# Patient Record
Sex: Female | Born: 1940 | Race: White | Hispanic: No | Marital: Married | Smoking: Former smoker
Health system: Southern US, Community
[De-identification: ages and names within clinical notes are randomized; demographics above are authoritative.]

## PROBLEM LIST (undated history)

## (undated) DIAGNOSIS — J301 Allergic rhinitis due to pollen: Secondary | ICD-10-CM

## (undated) DIAGNOSIS — R000 Tachycardia, unspecified: Secondary | ICD-10-CM

## (undated) DIAGNOSIS — R05 Cough: Secondary | ICD-10-CM

## (undated) DIAGNOSIS — R413 Other amnesia: Principal | ICD-10-CM

## (undated) DIAGNOSIS — I10 Essential (primary) hypertension: Secondary | ICD-10-CM

## (undated) DIAGNOSIS — R7301 Impaired fasting glucose: Secondary | ICD-10-CM

## (undated) DIAGNOSIS — M899 Disorder of bone, unspecified: Secondary | ICD-10-CM

## (undated) DIAGNOSIS — R053 Chronic cough: Secondary | ICD-10-CM

## (undated) DIAGNOSIS — J310 Chronic rhinitis: Secondary | ICD-10-CM

## (undated) DIAGNOSIS — R269 Unspecified abnormalities of gait and mobility: Secondary | ICD-10-CM

## (undated) DIAGNOSIS — M85852 Other specified disorders of bone density and structure, left thigh: Secondary | ICD-10-CM

## (undated) DIAGNOSIS — E785 Hyperlipidemia, unspecified: Secondary | ICD-10-CM

## (undated) HISTORY — PX: TUBAL LIGATION: SHX77

## (undated) HISTORY — DX: Tachycardia, unspecified: R00.0

## (undated) HISTORY — PX: DILATION AND CURETTAGE OF UTERUS: SHX78

## (undated) HISTORY — DX: Other amnesia: R41.3

## (undated) HISTORY — DX: Allergic rhinitis due to pollen: J30.1

## (undated) HISTORY — DX: Unspecified abnormalities of gait and mobility: R26.9

## (undated) HISTORY — DX: Cough: R05

## (undated) HISTORY — DX: Disorder of bone, unspecified: M89.9

## (undated) HISTORY — DX: Impaired fasting glucose: R73.01

## (undated) HISTORY — PX: COLONOSCOPY: SHX174

## (undated) HISTORY — DX: Other specified disorders of bone density and structure, left thigh: M85.852

## (undated) HISTORY — DX: Chronic cough: R05.3

## (undated) HISTORY — DX: Essential (primary) hypertension: I10

## (undated) HISTORY — DX: Hyperlipidemia, unspecified: E78.5

## (undated) HISTORY — DX: Chronic rhinitis: J31.0

---

## 1997-12-20 ENCOUNTER — Ambulatory Visit (HOSPITAL_COMMUNITY): Admission: RE | Admit: 1997-12-20 | Discharge: 1997-12-20 | Payer: Self-pay | Admitting: Family Medicine

## 1997-12-26 ENCOUNTER — Other Ambulatory Visit: Admission: RE | Admit: 1997-12-26 | Discharge: 1997-12-26 | Payer: Self-pay | Admitting: Family Medicine

## 1999-01-03 ENCOUNTER — Other Ambulatory Visit: Admission: RE | Admit: 1999-01-03 | Discharge: 1999-01-03 | Payer: Self-pay | Admitting: Family Medicine

## 2000-01-01 ENCOUNTER — Other Ambulatory Visit: Admission: RE | Admit: 2000-01-01 | Discharge: 2000-01-01 | Payer: Self-pay | Admitting: Family Medicine

## 2000-12-11 ENCOUNTER — Encounter: Payer: Self-pay | Admitting: Family Medicine

## 2000-12-11 ENCOUNTER — Encounter: Admission: RE | Admit: 2000-12-11 | Discharge: 2000-12-11 | Payer: Self-pay | Admitting: Family Medicine

## 2001-01-01 ENCOUNTER — Other Ambulatory Visit: Admission: RE | Admit: 2001-01-01 | Discharge: 2001-01-01 | Payer: Self-pay | Admitting: Family Medicine

## 2001-12-22 ENCOUNTER — Encounter: Admission: RE | Admit: 2001-12-22 | Discharge: 2001-12-22 | Payer: Self-pay | Admitting: Family Medicine

## 2001-12-22 ENCOUNTER — Encounter: Payer: Self-pay | Admitting: Family Medicine

## 2002-01-05 ENCOUNTER — Other Ambulatory Visit: Admission: RE | Admit: 2002-01-05 | Discharge: 2002-01-05 | Payer: Self-pay | Admitting: Family Medicine

## 2003-01-04 ENCOUNTER — Encounter: Payer: Self-pay | Admitting: Family Medicine

## 2003-01-04 ENCOUNTER — Encounter: Admission: RE | Admit: 2003-01-04 | Discharge: 2003-01-04 | Payer: Self-pay | Admitting: Family Medicine

## 2003-01-07 ENCOUNTER — Other Ambulatory Visit: Admission: RE | Admit: 2003-01-07 | Discharge: 2003-01-07 | Payer: Self-pay | Admitting: Family Medicine

## 2003-01-12 ENCOUNTER — Encounter: Admission: RE | Admit: 2003-01-12 | Discharge: 2003-01-12 | Payer: Self-pay | Admitting: Family Medicine

## 2003-01-12 ENCOUNTER — Encounter: Payer: Self-pay | Admitting: Family Medicine

## 2003-02-01 ENCOUNTER — Encounter: Payer: Self-pay | Admitting: Family Medicine

## 2003-02-01 ENCOUNTER — Encounter: Admission: RE | Admit: 2003-02-01 | Discharge: 2003-02-01 | Payer: Self-pay | Admitting: Family Medicine

## 2004-01-05 ENCOUNTER — Encounter: Admission: RE | Admit: 2004-01-05 | Discharge: 2004-01-05 | Payer: Self-pay | Admitting: Family Medicine

## 2004-01-11 ENCOUNTER — Other Ambulatory Visit: Admission: RE | Admit: 2004-01-11 | Discharge: 2004-01-11 | Payer: Self-pay | Admitting: Family Medicine

## 2005-01-14 ENCOUNTER — Encounter: Admission: RE | Admit: 2005-01-14 | Discharge: 2005-01-14 | Payer: Self-pay | Admitting: Family Medicine

## 2005-01-14 ENCOUNTER — Other Ambulatory Visit: Admission: RE | Admit: 2005-01-14 | Discharge: 2005-01-14 | Payer: Self-pay | Admitting: Family Medicine

## 2005-01-29 ENCOUNTER — Ambulatory Visit: Payer: Self-pay | Admitting: Internal Medicine

## 2005-02-15 ENCOUNTER — Ambulatory Visit: Payer: Self-pay | Admitting: Internal Medicine

## 2005-08-29 ENCOUNTER — Emergency Department (HOSPITAL_COMMUNITY): Admission: EM | Admit: 2005-08-29 | Discharge: 2005-08-29 | Payer: Self-pay | Admitting: Emergency Medicine

## 2006-01-16 ENCOUNTER — Encounter: Admission: RE | Admit: 2006-01-16 | Discharge: 2006-01-16 | Payer: Self-pay | Admitting: Family Medicine

## 2006-01-27 ENCOUNTER — Encounter: Admission: RE | Admit: 2006-01-27 | Discharge: 2006-01-27 | Payer: Self-pay | Admitting: Family Medicine

## 2007-01-19 ENCOUNTER — Encounter: Admission: RE | Admit: 2007-01-19 | Discharge: 2007-01-19 | Payer: Self-pay | Admitting: Family Medicine

## 2007-12-28 IMAGING — CT CT HEAD W/O CM
1 series · 16 of 30 positions shown, 20 images · IV contrast (agent unspecified)
Comparison: None available.

CLINICAL DATA: Fell striking forehead.  
 HEAD CT WITHOUT CONTRAST:
TECHNIQUE: Contiguous axial images were obtained from the base of the skull through the vertex according to standard protocol without contrast.

[Series 2: head_seq 4.5 h45s st · axial · 0.43mm/px · z∈[-161,-17]mm · 16 of 36 slices shown, 20 images]
[im 2/36  brain]
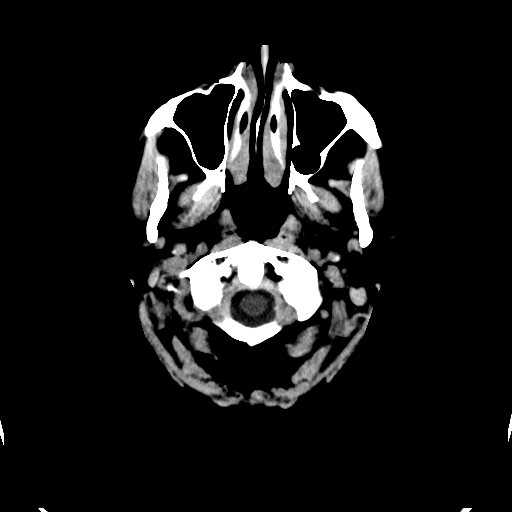
[im 2/36  bone]
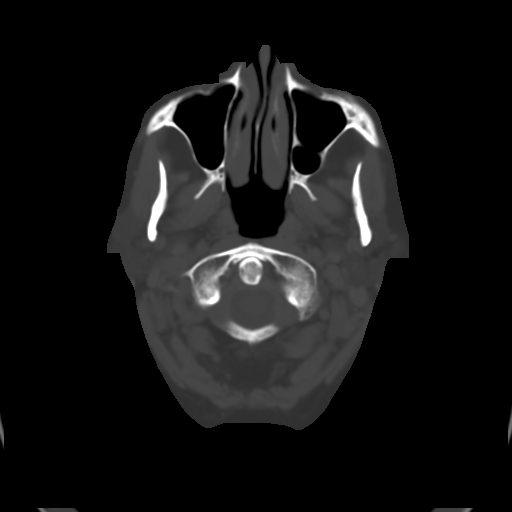
[im 4/36  brain]
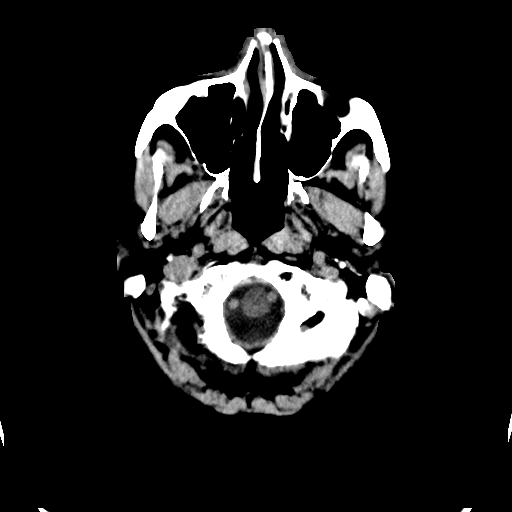
[im 7/36  brain]
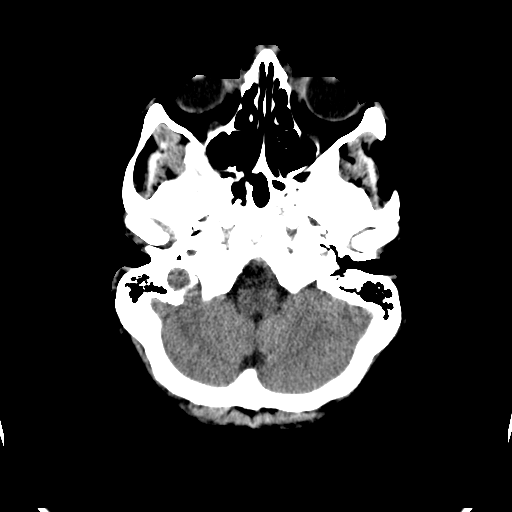
[im 9/36  brain]
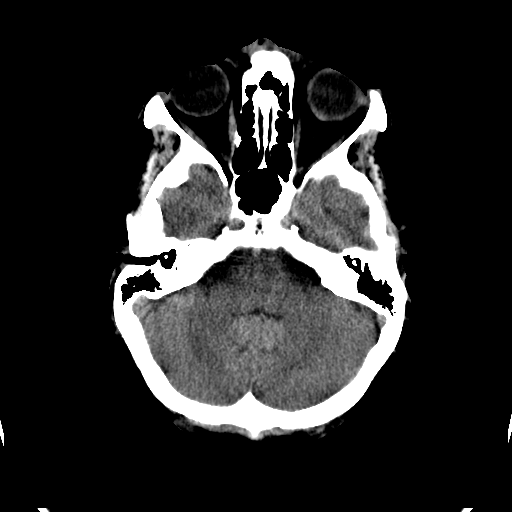
[im 10/36  brain]
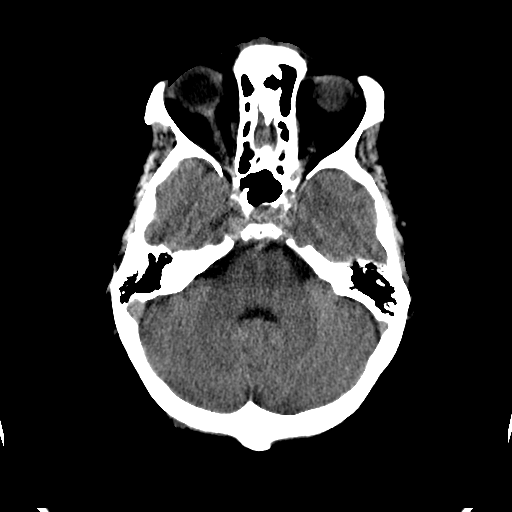
[im 10/36  bone]
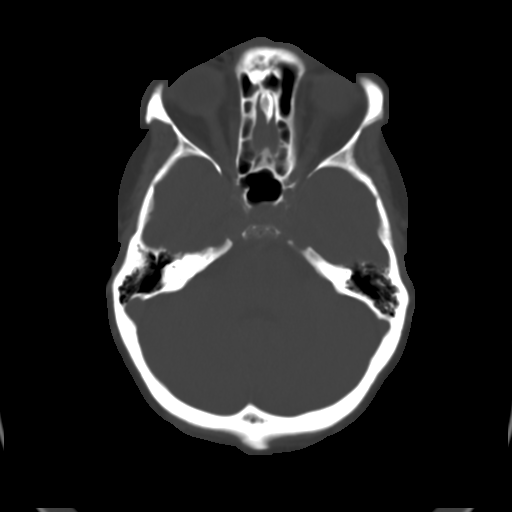
[im 13/36  brain]
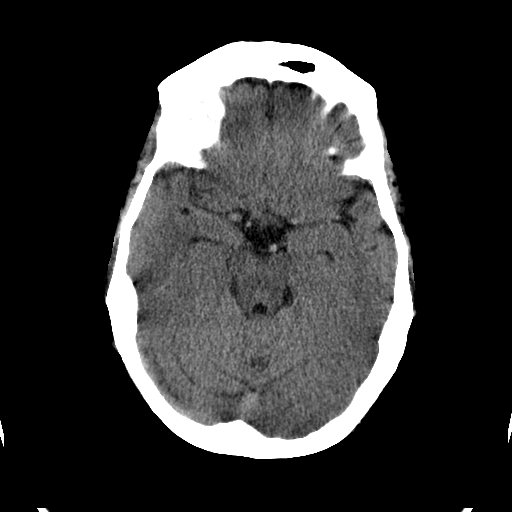
[im 15/36  brain]
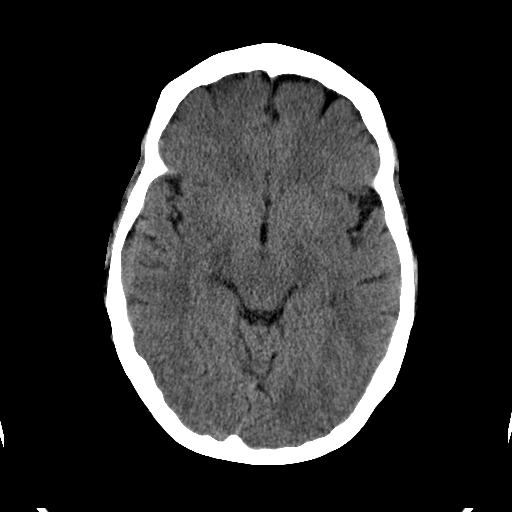
[im 17/36  brain]
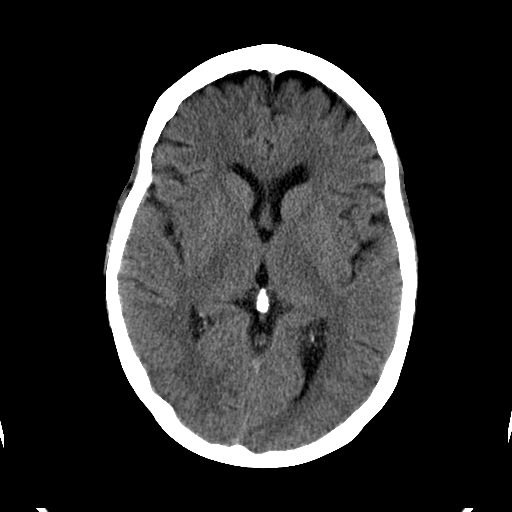
[im 19/36  brain]
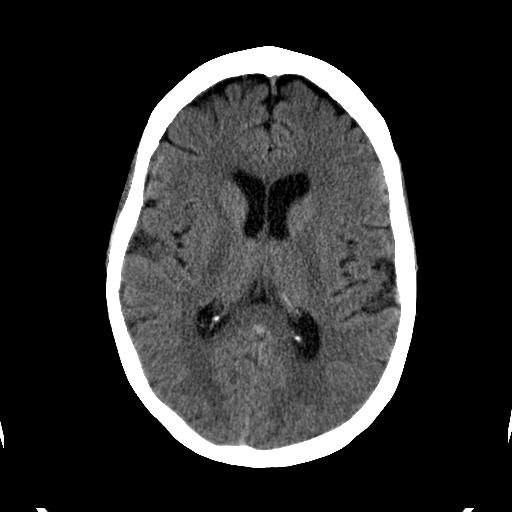
[im 19/36  bone]
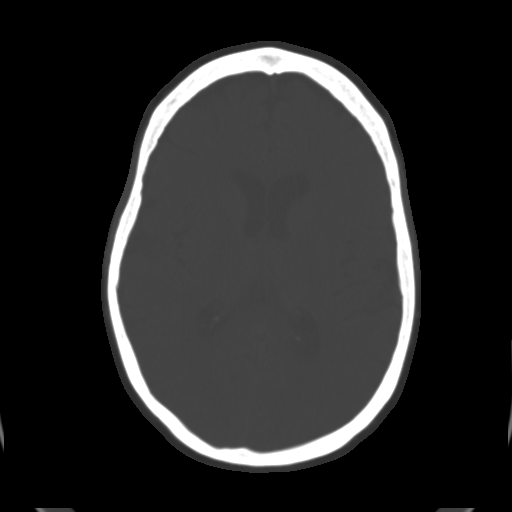
[im 21/36  brain]
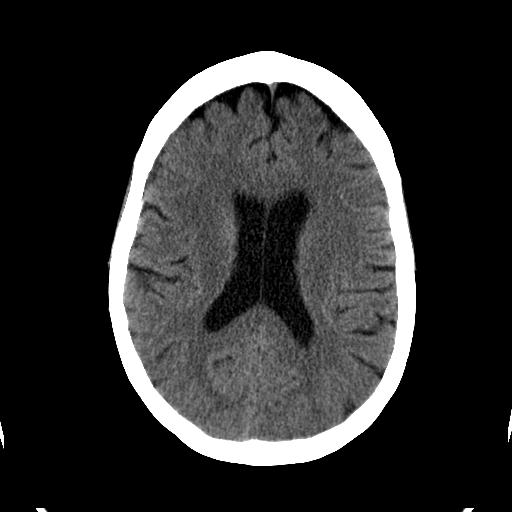
[im 23/36  brain]
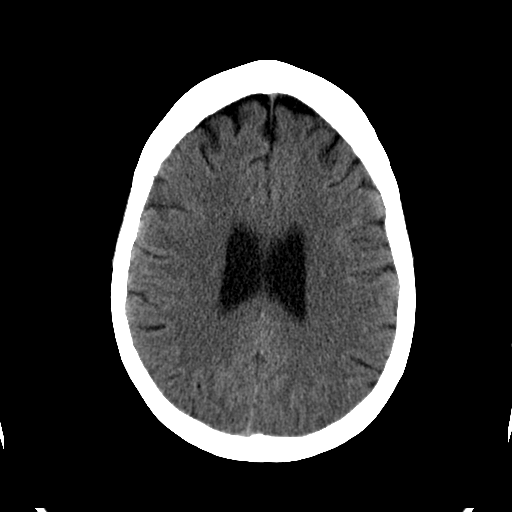
[im 26/36  brain]
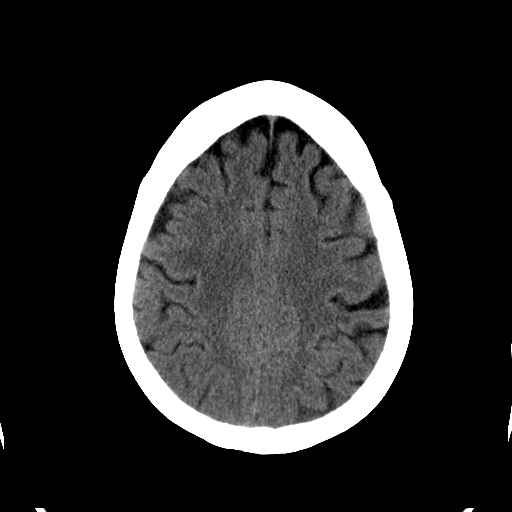
[im 27/36  brain]
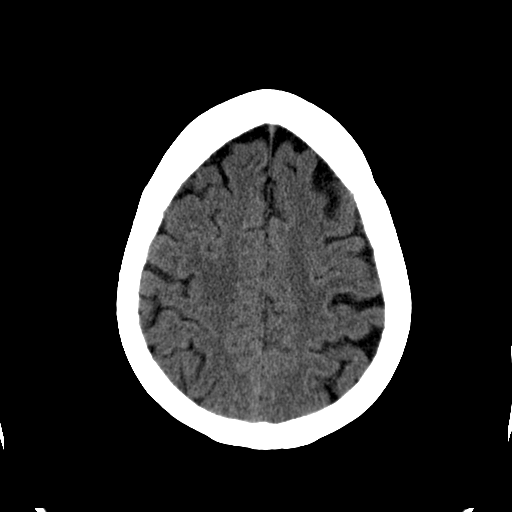
[im 27/36  bone]
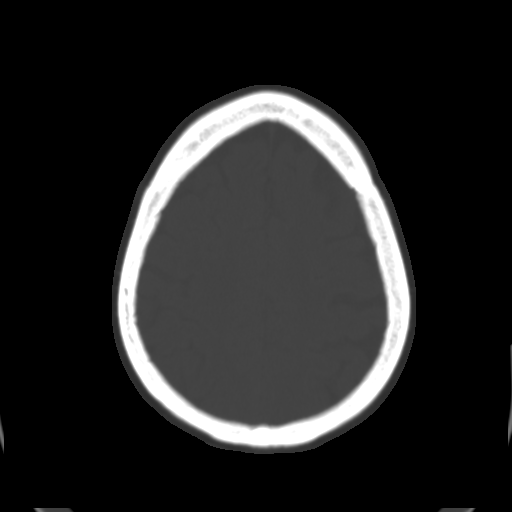
[im 29/36  brain]
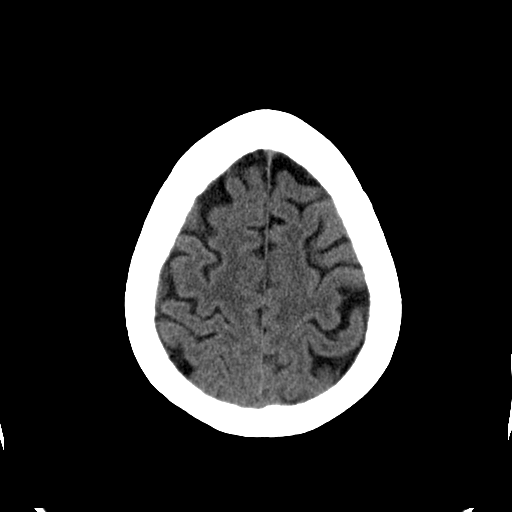
[im 32/36  brain]
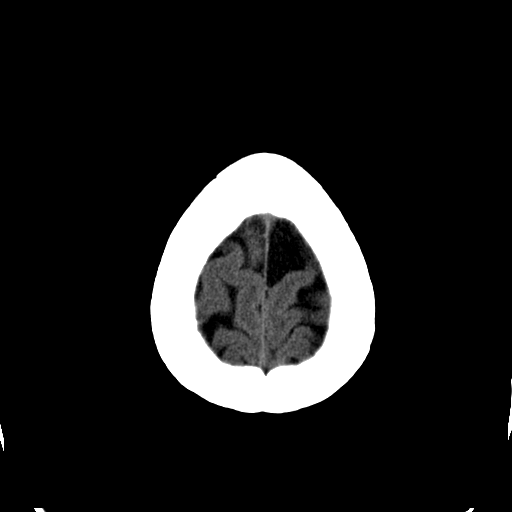
[im 34/36  brain]
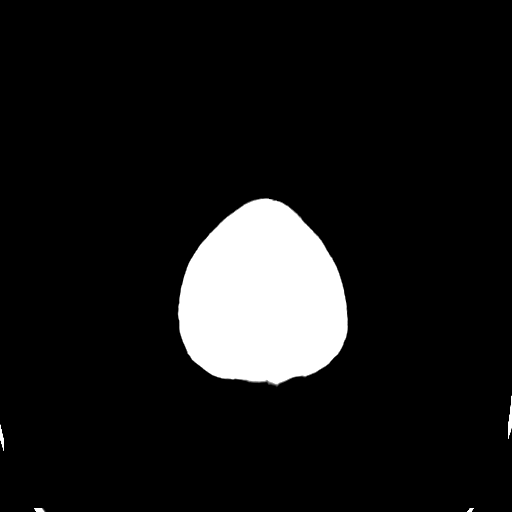

[16 of 30 positions shown; findings below may reference images not displayed]

FINDINGS: No acute or focal intracranial abnormality.  Calvarium intact.  There is some soft tissue swelling and air in the soft tissues anterior to the nose and right supraorbital region.  There are nasal fractures noted.  No fluid in the sinuses.
IMPRESSION: Negative except for nasal fractures with air and swelling in the soft tissues around the nose.

## 2008-01-20 ENCOUNTER — Encounter: Admission: RE | Admit: 2008-01-20 | Discharge: 2008-01-20 | Payer: Self-pay | Admitting: Family Medicine

## 2008-04-08 ENCOUNTER — Encounter: Admission: RE | Admit: 2008-04-08 | Discharge: 2008-04-08 | Payer: Self-pay | Admitting: Family Medicine

## 2009-01-20 ENCOUNTER — Encounter: Admission: RE | Admit: 2009-01-20 | Discharge: 2009-01-20 | Payer: Self-pay | Admitting: Family Medicine

## 2009-04-10 ENCOUNTER — Other Ambulatory Visit: Admission: RE | Admit: 2009-04-10 | Discharge: 2009-04-10 | Payer: Self-pay | Admitting: Family Medicine

## 2010-01-22 ENCOUNTER — Encounter: Admission: RE | Admit: 2010-01-22 | Discharge: 2010-01-22 | Payer: Self-pay | Admitting: Family Medicine

## 2010-06-12 ENCOUNTER — Encounter: Admission: RE | Admit: 2010-06-12 | Discharge: 2010-06-12 | Payer: Self-pay | Admitting: Family Medicine

## 2010-12-07 ENCOUNTER — Other Ambulatory Visit: Payer: Self-pay | Admitting: Family Medicine

## 2010-12-07 DIAGNOSIS — R300 Dysuria: Secondary | ICD-10-CM

## 2010-12-10 ENCOUNTER — Ambulatory Visit
Admission: RE | Admit: 2010-12-10 | Discharge: 2010-12-10 | Disposition: A | Discharge status: Home / Self Care | Payer: Medicare Other | Source: Ambulatory Visit | Attending: Family Medicine | Admitting: Family Medicine

## 2010-12-10 DIAGNOSIS — R300 Dysuria: Secondary | ICD-10-CM

## 2011-01-01 ENCOUNTER — Other Ambulatory Visit: Payer: Self-pay | Admitting: Family Medicine

## 2011-01-01 DIAGNOSIS — Z1231 Encounter for screening mammogram for malignant neoplasm of breast: Secondary | ICD-10-CM

## 2011-01-25 ENCOUNTER — Ambulatory Visit
Admission: RE | Admit: 2011-01-25 | Discharge: 2011-01-25 | Disposition: A | Discharge status: Home / Self Care | Payer: Medicare Other | Source: Ambulatory Visit | Attending: Family Medicine | Admitting: Family Medicine

## 2011-01-25 DIAGNOSIS — Z1231 Encounter for screening mammogram for malignant neoplasm of breast: Secondary | ICD-10-CM

## 2011-08-21 DIAGNOSIS — N281 Cyst of kidney, acquired: Secondary | ICD-10-CM | POA: Diagnosis not present

## 2011-08-21 DIAGNOSIS — N3943 Post-void dribbling: Secondary | ICD-10-CM | POA: Diagnosis not present

## 2011-09-18 DIAGNOSIS — R300 Dysuria: Secondary | ICD-10-CM | POA: Diagnosis not present

## 2011-09-18 DIAGNOSIS — M6281 Muscle weakness (generalized): Secondary | ICD-10-CM | POA: Diagnosis not present

## 2011-09-18 DIAGNOSIS — N3943 Post-void dribbling: Secondary | ICD-10-CM | POA: Diagnosis not present

## 2011-09-23 DIAGNOSIS — N393 Stress incontinence (female) (male): Secondary | ICD-10-CM | POA: Diagnosis not present

## 2011-09-23 DIAGNOSIS — R300 Dysuria: Secondary | ICD-10-CM | POA: Diagnosis not present

## 2011-09-23 DIAGNOSIS — R279 Unspecified lack of coordination: Secondary | ICD-10-CM | POA: Diagnosis not present

## 2011-09-23 DIAGNOSIS — M6281 Muscle weakness (generalized): Secondary | ICD-10-CM | POA: Diagnosis not present

## 2011-10-01 DIAGNOSIS — I10 Essential (primary) hypertension: Secondary | ICD-10-CM | POA: Diagnosis not present

## 2011-10-01 DIAGNOSIS — J309 Allergic rhinitis, unspecified: Secondary | ICD-10-CM | POA: Diagnosis not present

## 2011-10-01 DIAGNOSIS — M949 Disorder of cartilage, unspecified: Secondary | ICD-10-CM | POA: Diagnosis not present

## 2011-10-01 DIAGNOSIS — E782 Mixed hyperlipidemia: Secondary | ICD-10-CM | POA: Diagnosis not present

## 2011-10-01 DIAGNOSIS — N3946 Mixed incontinence: Secondary | ICD-10-CM | POA: Diagnosis not present

## 2011-10-02 DIAGNOSIS — R300 Dysuria: Secondary | ICD-10-CM | POA: Diagnosis not present

## 2011-10-02 DIAGNOSIS — R279 Unspecified lack of coordination: Secondary | ICD-10-CM | POA: Diagnosis not present

## 2011-10-02 DIAGNOSIS — M6281 Muscle weakness (generalized): Secondary | ICD-10-CM | POA: Diagnosis not present

## 2011-10-02 DIAGNOSIS — N393 Stress incontinence (female) (male): Secondary | ICD-10-CM | POA: Diagnosis not present

## 2011-10-16 DIAGNOSIS — N3943 Post-void dribbling: Secondary | ICD-10-CM | POA: Diagnosis not present

## 2011-10-16 DIAGNOSIS — M6281 Muscle weakness (generalized): Secondary | ICD-10-CM | POA: Diagnosis not present

## 2011-10-16 DIAGNOSIS — R279 Unspecified lack of coordination: Secondary | ICD-10-CM | POA: Diagnosis not present

## 2011-10-16 DIAGNOSIS — N393 Stress incontinence (female) (male): Secondary | ICD-10-CM | POA: Diagnosis not present

## 2011-11-13 DIAGNOSIS — R279 Unspecified lack of coordination: Secondary | ICD-10-CM | POA: Diagnosis not present

## 2011-11-13 DIAGNOSIS — M6281 Muscle weakness (generalized): Secondary | ICD-10-CM | POA: Diagnosis not present

## 2011-11-13 DIAGNOSIS — N393 Stress incontinence (female) (male): Secondary | ICD-10-CM | POA: Diagnosis not present

## 2011-12-16 ENCOUNTER — Other Ambulatory Visit: Payer: Self-pay | Admitting: Family Medicine

## 2011-12-16 DIAGNOSIS — Z1231 Encounter for screening mammogram for malignant neoplasm of breast: Secondary | ICD-10-CM

## 2012-01-27 ENCOUNTER — Ambulatory Visit
Admission: RE | Admit: 2012-01-27 | Discharge: 2012-01-27 | Disposition: A | Discharge status: Home / Self Care | Payer: Medicare Other | Source: Ambulatory Visit | Attending: Family Medicine | Admitting: Family Medicine

## 2012-01-27 DIAGNOSIS — Z1231 Encounter for screening mammogram for malignant neoplasm of breast: Secondary | ICD-10-CM | POA: Diagnosis not present

## 2012-03-04 DIAGNOSIS — H43819 Vitreous degeneration, unspecified eye: Secondary | ICD-10-CM | POA: Diagnosis not present

## 2012-03-04 DIAGNOSIS — H2510 Age-related nuclear cataract, unspecified eye: Secondary | ICD-10-CM | POA: Diagnosis not present

## 2012-03-04 DIAGNOSIS — H3530 Unspecified macular degeneration: Secondary | ICD-10-CM | POA: Diagnosis not present

## 2012-04-19 DIAGNOSIS — Z23 Encounter for immunization: Secondary | ICD-10-CM | POA: Diagnosis not present

## 2012-05-05 ENCOUNTER — Other Ambulatory Visit: Payer: Self-pay | Admitting: Family Medicine

## 2012-05-05 DIAGNOSIS — E559 Vitamin D deficiency, unspecified: Secondary | ICD-10-CM | POA: Diagnosis not present

## 2012-05-05 DIAGNOSIS — E782 Mixed hyperlipidemia: Secondary | ICD-10-CM | POA: Diagnosis not present

## 2012-05-05 DIAGNOSIS — Z01419 Encounter for gynecological examination (general) (routine) without abnormal findings: Secondary | ICD-10-CM | POA: Diagnosis not present

## 2012-05-05 DIAGNOSIS — Z0000 Encounter for general adult medical examination without abnormal findings: Secondary | ICD-10-CM | POA: Diagnosis not present

## 2012-05-05 DIAGNOSIS — I10 Essential (primary) hypertension: Secondary | ICD-10-CM | POA: Diagnosis not present

## 2012-05-05 DIAGNOSIS — M899 Disorder of bone, unspecified: Secondary | ICD-10-CM

## 2012-05-05 DIAGNOSIS — J309 Allergic rhinitis, unspecified: Secondary | ICD-10-CM | POA: Diagnosis not present

## 2012-05-05 DIAGNOSIS — Z1331 Encounter for screening for depression: Secondary | ICD-10-CM | POA: Diagnosis not present

## 2012-06-23 ENCOUNTER — Ambulatory Visit
Admission: RE | Admit: 2012-06-23 | Discharge: 2012-06-23 | Disposition: A | Discharge status: Home / Self Care | Payer: Medicare Other | Source: Ambulatory Visit | Attending: Family Medicine | Admitting: Family Medicine

## 2012-06-23 DIAGNOSIS — M899 Disorder of bone, unspecified: Secondary | ICD-10-CM | POA: Diagnosis not present

## 2012-06-23 DIAGNOSIS — M949 Disorder of cartilage, unspecified: Secondary | ICD-10-CM | POA: Diagnosis not present

## 2012-11-02 DIAGNOSIS — M899 Disorder of bone, unspecified: Secondary | ICD-10-CM | POA: Diagnosis not present

## 2012-11-02 DIAGNOSIS — N3946 Mixed incontinence: Secondary | ICD-10-CM | POA: Diagnosis not present

## 2012-11-02 DIAGNOSIS — I10 Essential (primary) hypertension: Secondary | ICD-10-CM | POA: Diagnosis not present

## 2012-11-02 DIAGNOSIS — E782 Mixed hyperlipidemia: Secondary | ICD-10-CM | POA: Diagnosis not present

## 2012-11-02 DIAGNOSIS — J309 Allergic rhinitis, unspecified: Secondary | ICD-10-CM | POA: Diagnosis not present

## 2012-11-04 DIAGNOSIS — M948X9 Other specified disorders of cartilage, unspecified sites: Secondary | ICD-10-CM | POA: Diagnosis not present

## 2012-11-04 DIAGNOSIS — M272 Inflammatory conditions of jaws: Secondary | ICD-10-CM | POA: Diagnosis not present

## 2012-11-10 DIAGNOSIS — M948X9 Other specified disorders of cartilage, unspecified sites: Secondary | ICD-10-CM | POA: Diagnosis not present

## 2012-11-10 DIAGNOSIS — M272 Inflammatory conditions of jaws: Secondary | ICD-10-CM | POA: Diagnosis not present

## 2012-12-04 DIAGNOSIS — L259 Unspecified contact dermatitis, unspecified cause: Secondary | ICD-10-CM | POA: Diagnosis not present

## 2012-12-09 DIAGNOSIS — L255 Unspecified contact dermatitis due to plants, except food: Secondary | ICD-10-CM | POA: Diagnosis not present

## 2012-12-21 ENCOUNTER — Other Ambulatory Visit: Payer: Self-pay

## 2012-12-21 DIAGNOSIS — Z1231 Encounter for screening mammogram for malignant neoplasm of breast: Secondary | ICD-10-CM

## 2013-01-27 ENCOUNTER — Ambulatory Visit: Payer: Medicare Other

## 2013-02-03 ENCOUNTER — Ambulatory Visit
Admission: RE | Admit: 2013-02-03 | Discharge: 2013-02-03 | Disposition: A | Discharge status: Home / Self Care | Payer: Medicare Other | Source: Ambulatory Visit

## 2013-02-03 DIAGNOSIS — Z1231 Encounter for screening mammogram for malignant neoplasm of breast: Secondary | ICD-10-CM | POA: Diagnosis not present

## 2013-03-09 DIAGNOSIS — H2510 Age-related nuclear cataract, unspecified eye: Secondary | ICD-10-CM | POA: Diagnosis not present

## 2013-03-09 DIAGNOSIS — H3530 Unspecified macular degeneration: Secondary | ICD-10-CM | POA: Diagnosis not present

## 2013-03-30 DIAGNOSIS — Z23 Encounter for immunization: Secondary | ICD-10-CM | POA: Diagnosis not present

## 2013-04-09 IMAGING — US US RENAL
1 series · 14 of 25 positions shown · non-contrast
Comparison: None.

CLINICAL DATA: Visible hematuria.  No pain.  History of left upper
pole cyst.

RENAL/URINARY TRACT ULTRASOUND COMPLETE

[Series 1: us renal · 0.22mm/px · 14 of 35 slices shown]
[im 1/35]
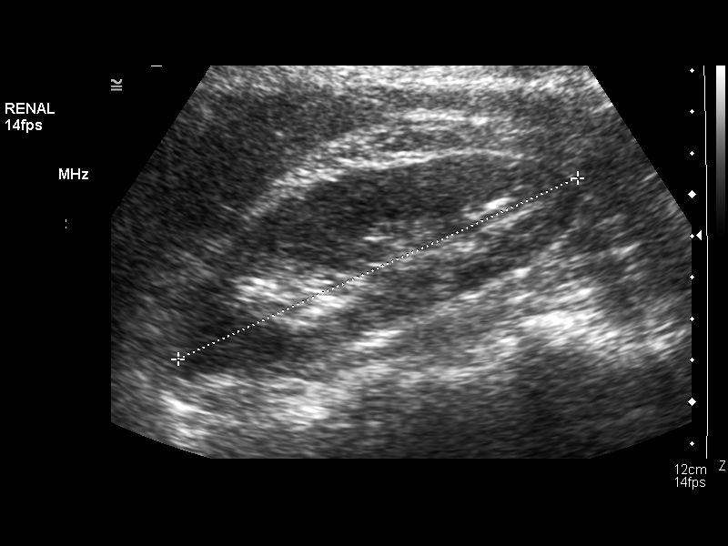
[im 3/35]
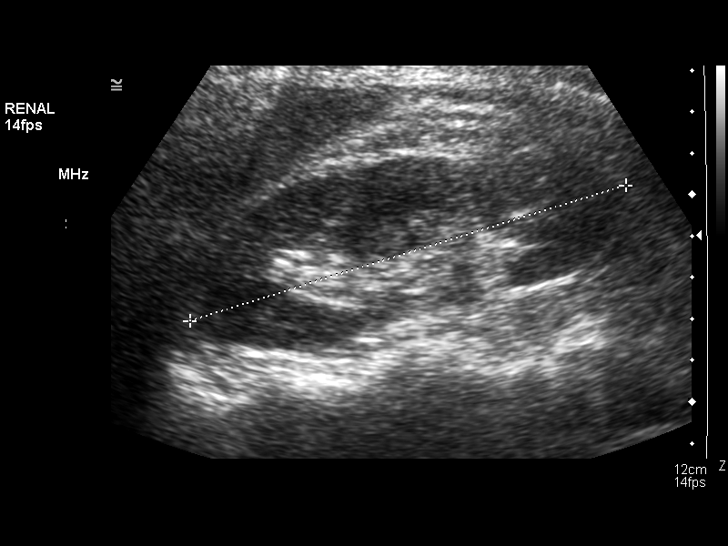
[im 6/35]
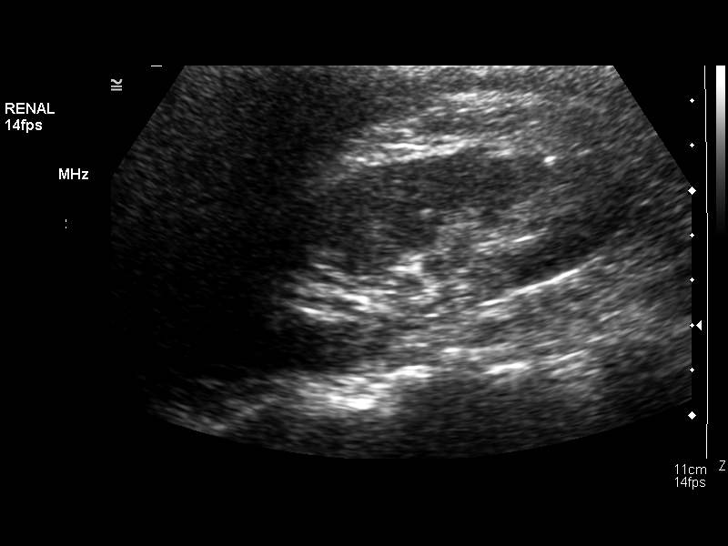
[im 9/35]
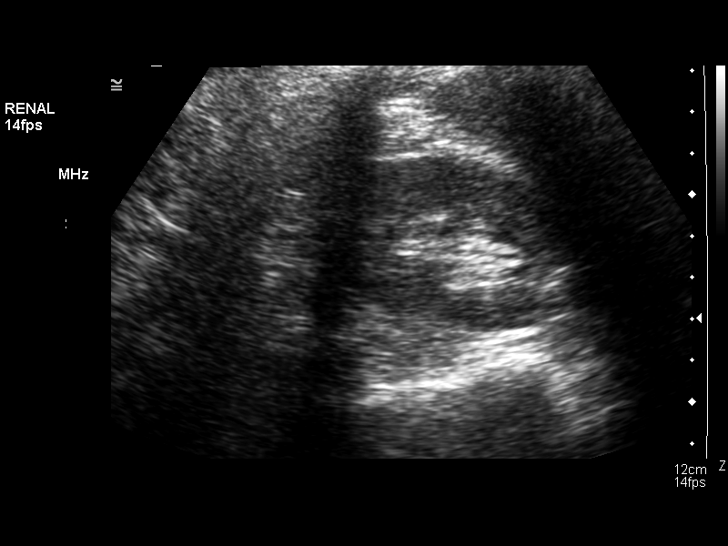
[im 12/35]
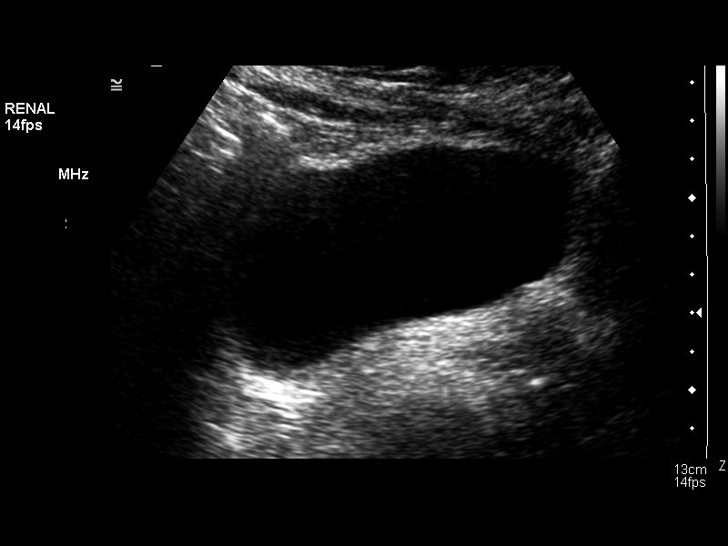
[im 13/35]
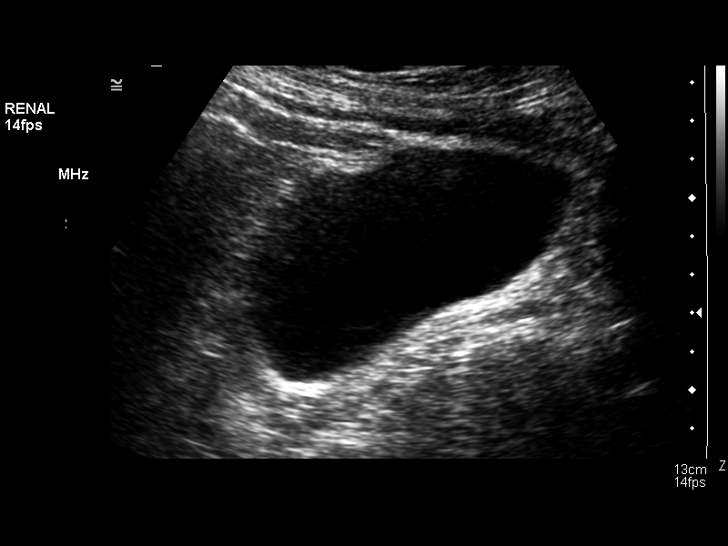
[im 16/35]
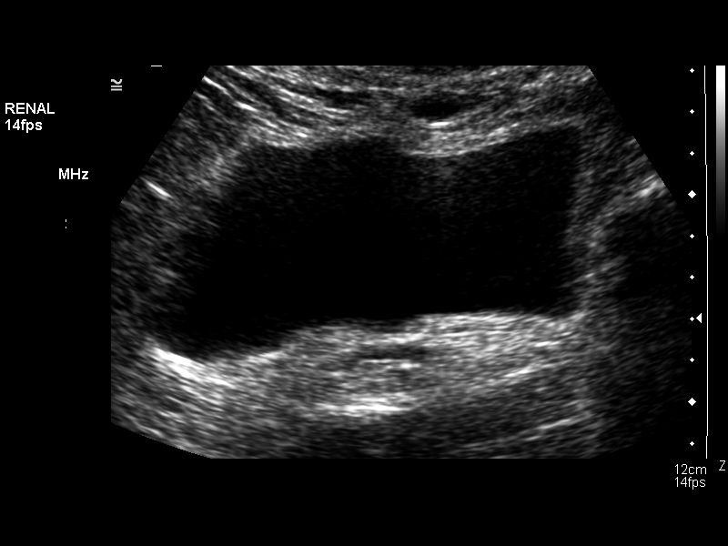
[im 19/35]
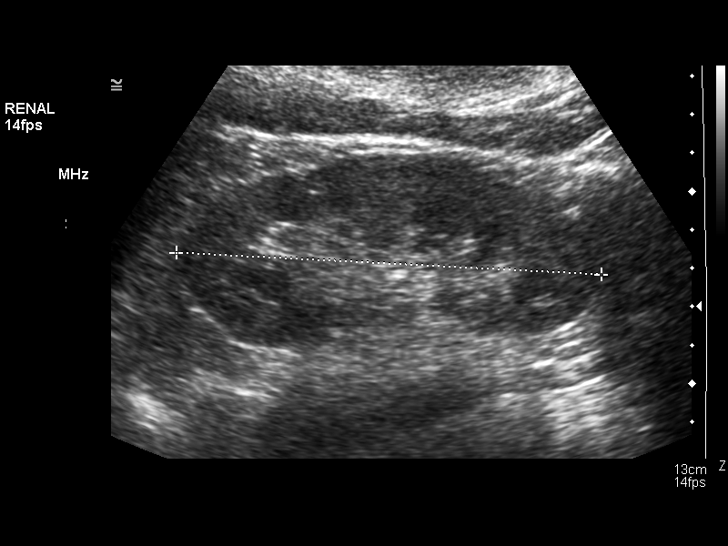
[im 22/35]
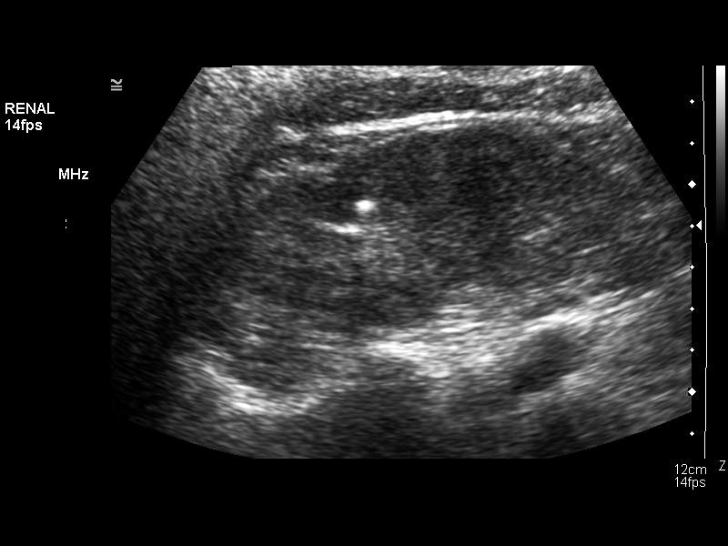
[im 23/35]
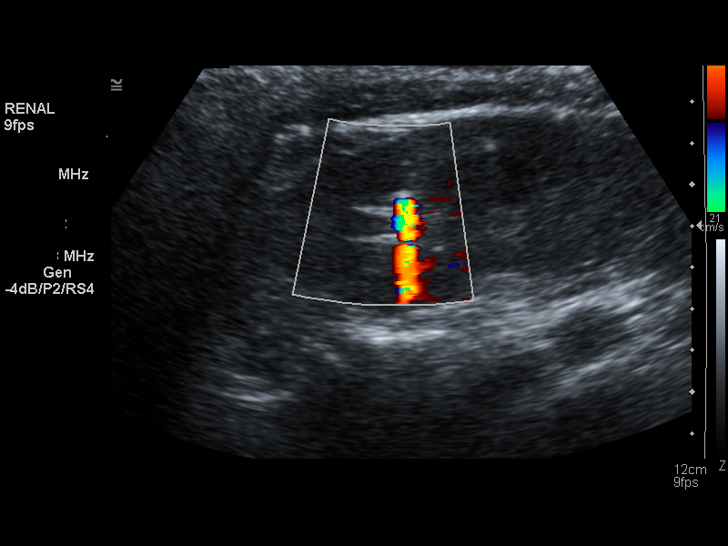
[im 26/35]
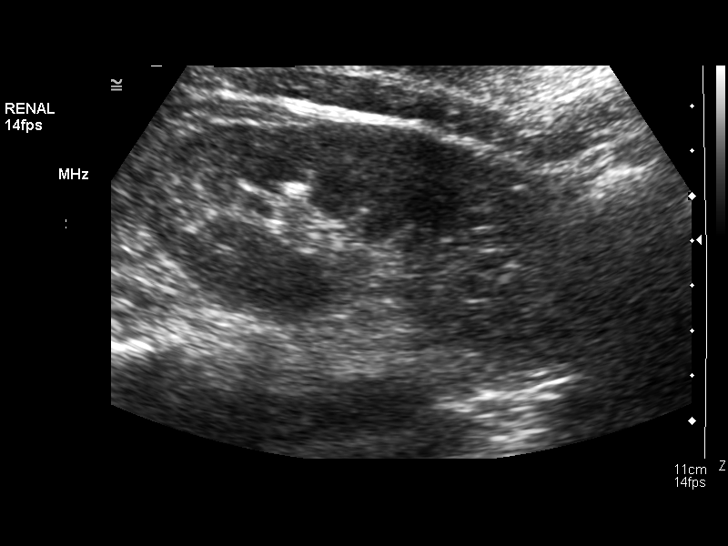
[im 29/35]
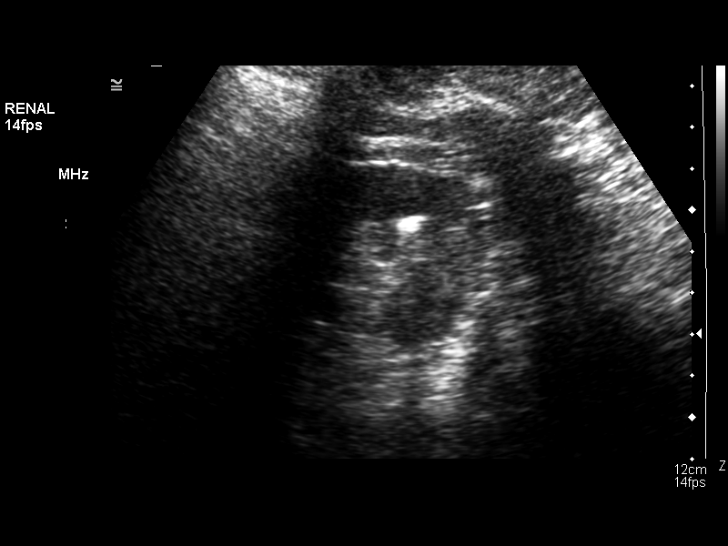
[im 32/35]
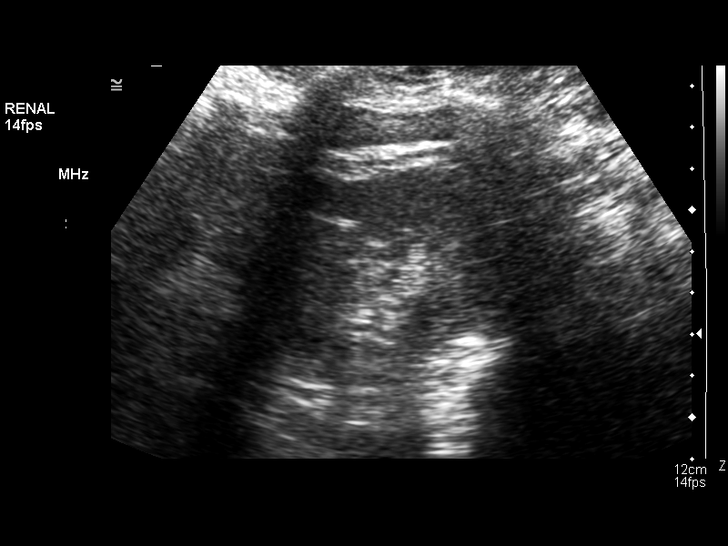
[im 35/35]
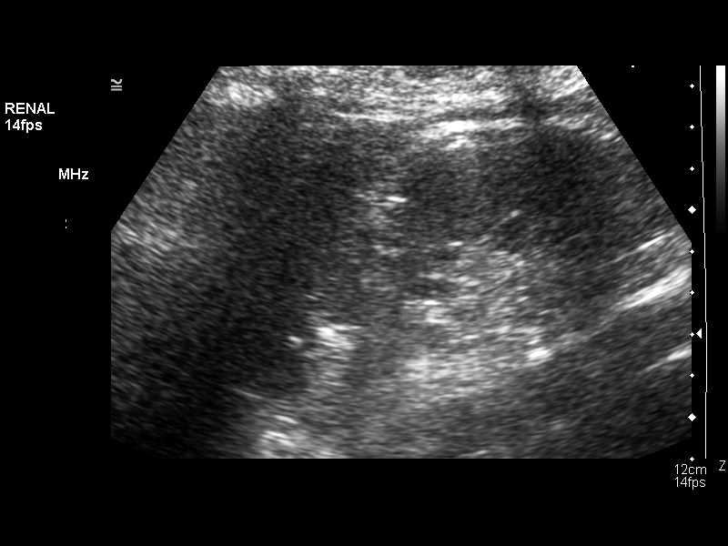

[14 of 25 positions shown; findings below may reference images not displayed]

FINDINGS: Right Kidney:  Right kidney is 11.0 cm in length.  No mass or
hydronephrosis.

Left Kidney:  Left kidney is 11.6 cm in length.  Small probable
renal calculus measures 6 mm in the upper pole region measuring 6
mm in diameter.  No evidence for solid or cystic renal mass.  No
hydronephrosis.

Bladder:  The bladder has a normal appearance.
IMPRESSION: 1.  No evidence for renal mass or hydronephrosis.
2.  Left upper pole calculus.

## 2013-05-03 DIAGNOSIS — Z0000 Encounter for general adult medical examination without abnormal findings: Secondary | ICD-10-CM | POA: Diagnosis not present

## 2013-05-03 DIAGNOSIS — K219 Gastro-esophageal reflux disease without esophagitis: Secondary | ICD-10-CM | POA: Diagnosis not present

## 2013-05-03 DIAGNOSIS — E559 Vitamin D deficiency, unspecified: Secondary | ICD-10-CM | POA: Diagnosis not present

## 2013-05-03 DIAGNOSIS — E782 Mixed hyperlipidemia: Secondary | ICD-10-CM | POA: Diagnosis not present

## 2013-05-03 DIAGNOSIS — M899 Disorder of bone, unspecified: Secondary | ICD-10-CM | POA: Diagnosis not present

## 2013-05-03 DIAGNOSIS — I10 Essential (primary) hypertension: Secondary | ICD-10-CM | POA: Diagnosis not present

## 2013-05-03 DIAGNOSIS — N3946 Mixed incontinence: Secondary | ICD-10-CM | POA: Diagnosis not present

## 2013-05-03 DIAGNOSIS — J309 Allergic rhinitis, unspecified: Secondary | ICD-10-CM | POA: Diagnosis not present

## 2013-05-06 DIAGNOSIS — M899 Disorder of bone, unspecified: Secondary | ICD-10-CM | POA: Diagnosis not present

## 2013-05-06 DIAGNOSIS — I10 Essential (primary) hypertension: Secondary | ICD-10-CM | POA: Diagnosis not present

## 2013-05-06 DIAGNOSIS — Z0000 Encounter for general adult medical examination without abnormal findings: Secondary | ICD-10-CM | POA: Diagnosis not present

## 2013-05-06 DIAGNOSIS — N3946 Mixed incontinence: Secondary | ICD-10-CM | POA: Diagnosis not present

## 2013-05-06 DIAGNOSIS — Z1331 Encounter for screening for depression: Secondary | ICD-10-CM | POA: Diagnosis not present

## 2013-05-06 DIAGNOSIS — J309 Allergic rhinitis, unspecified: Secondary | ICD-10-CM | POA: Diagnosis not present

## 2013-05-06 DIAGNOSIS — E782 Mixed hyperlipidemia: Secondary | ICD-10-CM | POA: Diagnosis not present

## 2014-01-03 ENCOUNTER — Other Ambulatory Visit: Payer: Self-pay

## 2014-01-03 DIAGNOSIS — Z1231 Encounter for screening mammogram for malignant neoplasm of breast: Secondary | ICD-10-CM

## 2014-02-04 ENCOUNTER — Ambulatory Visit
Admission: RE | Admit: 2014-02-04 | Discharge: 2014-02-04 | Disposition: A | Discharge status: Home / Self Care | Payer: Medicare Other | Source: Ambulatory Visit

## 2014-02-04 ENCOUNTER — Encounter (INDEPENDENT_AMBULATORY_CARE_PROVIDER_SITE_OTHER): Payer: Self-pay

## 2014-02-04 DIAGNOSIS — Z1231 Encounter for screening mammogram for malignant neoplasm of breast: Secondary | ICD-10-CM

## 2014-03-10 DIAGNOSIS — H2510 Age-related nuclear cataract, unspecified eye: Secondary | ICD-10-CM | POA: Diagnosis not present

## 2014-03-10 DIAGNOSIS — H3530 Unspecified macular degeneration: Secondary | ICD-10-CM | POA: Diagnosis not present

## 2014-03-25 ENCOUNTER — Ambulatory Visit (INDEPENDENT_AMBULATORY_CARE_PROVIDER_SITE_OTHER): Payer: Medicare Other

## 2014-03-25 VITALS — BP 140/79 | HR 92 | Resp 16 | Ht 64.0 in | Wt 160.0 lb

## 2014-03-25 DIAGNOSIS — S92919A Unspecified fracture of unspecified toe(s), initial encounter for closed fracture: Secondary | ICD-10-CM

## 2014-03-25 DIAGNOSIS — S99929A Unspecified injury of unspecified foot, initial encounter: Secondary | ICD-10-CM

## 2014-03-25 DIAGNOSIS — S8990XA Unspecified injury of unspecified lower leg, initial encounter: Secondary | ICD-10-CM | POA: Diagnosis not present

## 2014-03-25 DIAGNOSIS — S92912A Unspecified fracture of left toe(s), initial encounter for closed fracture: Secondary | ICD-10-CM

## 2014-03-25 DIAGNOSIS — S99919A Unspecified injury of unspecified ankle, initial encounter: Secondary | ICD-10-CM

## 2014-03-25 DIAGNOSIS — M79609 Pain in unspecified limb: Secondary | ICD-10-CM | POA: Diagnosis not present

## 2014-03-25 DIAGNOSIS — S99922A Unspecified injury of left foot, initial encounter: Secondary | ICD-10-CM

## 2014-03-25 DIAGNOSIS — M79676 Pain in unspecified toe(s): Secondary | ICD-10-CM

## 2014-03-25 NOTE — Patient Instructions (Signed)
ICE INSTRUCTIONS  Apply ice or cold pack to the affected area at least 3 times a day for 10-15 minutes each time.  You should also use ice after prolonged activity or vigorous exercise.  Do not apply ice longer than 20 minutes at one time.  Always keep a cloth between your skin and the ice pack to prevent burns.  Being consistent and following these instructions will help control your symptoms.  We suggest you purchase a gel ice pack because they are reusable and do bit leak.  Some of them are designed to wrap around the area.  Use the method that works best for you.  Here are some other suggestions for icing.   Use a frozen bag of peas or corn-inexpensive and molds well to your body, usually stays frozen for 10 to 20 minutes.  Wet a towel with cold water and squeeze out the excess until it's damp.  Place in a bag in the freezer for 20 minutes. Then remove and use.  For pain suggested Advil or Tylenol or ibuprofen as needed for pain  Maintain Coflex wrap in of toes, buddy wrap toes 3, 4 and 5 together . Also maintain a firm are stiff soled shoe at all times no barefoot or flimsy shoes.  The fractured fourth toe should heal within 4-6 weeks although swelling in aching this may last for 2-3 months

## 2014-03-25 NOTE — Progress Notes (Signed)
   Subjective:    Patient ID: Susan Hill, female    DOB: 1941/03/07, 73 y.o.   MRN: [id number]  HPI Comments: "I jammed my toe"  Patient c/o of aching 4th and 5th toes and forefoot left for about 2 weeks. She jammed her toe and split toes between the 3rd and 4th. The forefoot and 4th toe is swollen and bruised. Some better. Has been taping them.     Review of Systems  Allergic/Immunologic: Positive for environmental allergies.  All other systems reviewed and are negative.      Objective:   Physical Exam 73 year old white female well-developed well-nourished oriented x3 presents at this time with a couple day history of injury to her left forefoot in particular most painful fourth toe left. Neurovascular status is intact pedal pulses palpable. Epicritic and proprioceptive sensations intact and symmetric. There is normal plantar response DTRs not elicited dermatologically skin color pigment normal hair growth absent there is some edema of the fourth toe left foot area x-rays demonstrate a fracture the proximal phalanx base fourth with minimal or slight displacement. Fifth toe is intact no fractures noted mild soft tissue edema noted on x-rays noted no signs of infection no open wounds ulcerations       Assessment & Plan:  Assessment contusion of toe with subsequent fracture fourth toe proximal phalanx left foot. Plan at this time buddy wrapping of toes 34 and 5 is carried out once Coflex is dispensed to patient to maintain buddy wrapping at all times for at least 3-4 weeks. Reappointed one month if fails to resolve or improve or an as-needed basis. Recommended Tylenol or Advil as needed for pain also suggested ice pack to help with swelling and edema.  Harriet Masson DPM

## 2014-04-12 DIAGNOSIS — Z23 Encounter for immunization: Secondary | ICD-10-CM | POA: Diagnosis not present

## 2014-05-05 DIAGNOSIS — I10 Essential (primary) hypertension: Secondary | ICD-10-CM | POA: Diagnosis not present

## 2014-05-05 DIAGNOSIS — E785 Hyperlipidemia, unspecified: Secondary | ICD-10-CM | POA: Diagnosis not present

## 2014-05-05 DIAGNOSIS — M8580 Other specified disorders of bone density and structure, unspecified site: Secondary | ICD-10-CM | POA: Diagnosis not present

## 2014-05-10 DIAGNOSIS — Z1389 Encounter for screening for other disorder: Secondary | ICD-10-CM | POA: Diagnosis not present

## 2014-05-10 DIAGNOSIS — I10 Essential (primary) hypertension: Secondary | ICD-10-CM | POA: Diagnosis not present

## 2014-05-10 DIAGNOSIS — R7301 Impaired fasting glucose: Secondary | ICD-10-CM | POA: Diagnosis not present

## 2014-05-10 DIAGNOSIS — Z0000 Encounter for general adult medical examination without abnormal findings: Secondary | ICD-10-CM | POA: Diagnosis not present

## 2014-05-10 DIAGNOSIS — Z124 Encounter for screening for malignant neoplasm of cervix: Secondary | ICD-10-CM | POA: Diagnosis not present

## 2014-05-10 DIAGNOSIS — M899 Disorder of bone, unspecified: Secondary | ICD-10-CM | POA: Diagnosis not present

## 2014-05-10 DIAGNOSIS — Z23 Encounter for immunization: Secondary | ICD-10-CM | POA: Diagnosis not present

## 2014-05-10 DIAGNOSIS — E785 Hyperlipidemia, unspecified: Secondary | ICD-10-CM | POA: Diagnosis not present

## 2015-03-03 ENCOUNTER — Other Ambulatory Visit: Payer: Self-pay

## 2015-03-03 DIAGNOSIS — Z1231 Encounter for screening mammogram for malignant neoplasm of breast: Secondary | ICD-10-CM

## 2015-03-07 ENCOUNTER — Ambulatory Visit
Admission: RE | Admit: 2015-03-07 | Discharge: 2015-03-07 | Disposition: A | Discharge status: Home / Self Care | Payer: Medicare Other | Source: Ambulatory Visit

## 2015-03-07 DIAGNOSIS — Z1231 Encounter for screening mammogram for malignant neoplasm of breast: Secondary | ICD-10-CM

## 2015-03-14 DIAGNOSIS — H2513 Age-related nuclear cataract, bilateral: Secondary | ICD-10-CM | POA: Diagnosis not present

## 2015-03-23 ENCOUNTER — Encounter: Payer: Self-pay | Admitting: Internal Medicine

## 2015-03-24 ENCOUNTER — Encounter: Payer: Self-pay | Admitting: Internal Medicine

## 2015-04-04 DIAGNOSIS — Z23 Encounter for immunization: Secondary | ICD-10-CM | POA: Diagnosis not present

## 2015-05-15 DIAGNOSIS — I10 Essential (primary) hypertension: Secondary | ICD-10-CM | POA: Diagnosis not present

## 2015-05-15 DIAGNOSIS — M85852 Other specified disorders of bone density and structure, left thigh: Secondary | ICD-10-CM | POA: Diagnosis not present

## 2015-05-15 DIAGNOSIS — J309 Allergic rhinitis, unspecified: Secondary | ICD-10-CM | POA: Diagnosis not present

## 2015-05-15 DIAGNOSIS — Z1211 Encounter for screening for malignant neoplasm of colon: Secondary | ICD-10-CM | POA: Diagnosis not present

## 2015-05-15 DIAGNOSIS — E785 Hyperlipidemia, unspecified: Secondary | ICD-10-CM | POA: Diagnosis not present

## 2015-05-15 DIAGNOSIS — Z0000 Encounter for general adult medical examination without abnormal findings: Secondary | ICD-10-CM | POA: Diagnosis not present

## 2015-05-15 DIAGNOSIS — Z1389 Encounter for screening for other disorder: Secondary | ICD-10-CM | POA: Diagnosis not present

## 2015-05-15 DIAGNOSIS — M899 Disorder of bone, unspecified: Secondary | ICD-10-CM | POA: Diagnosis not present

## 2015-05-15 DIAGNOSIS — R7301 Impaired fasting glucose: Secondary | ICD-10-CM | POA: Diagnosis not present

## 2015-05-19 ENCOUNTER — Other Ambulatory Visit: Payer: Self-pay | Admitting: Family Medicine

## 2015-05-19 DIAGNOSIS — M8580 Other specified disorders of bone density and structure, unspecified site: Secondary | ICD-10-CM

## 2015-05-24 ENCOUNTER — Ambulatory Visit
Admission: RE | Admit: 2015-05-24 | Discharge: 2015-05-24 | Disposition: A | Discharge status: Home / Self Care | Payer: Medicare Other | Source: Ambulatory Visit | Attending: Family Medicine | Admitting: Family Medicine

## 2015-05-24 DIAGNOSIS — M8580 Other specified disorders of bone density and structure, unspecified site: Secondary | ICD-10-CM

## 2015-05-24 DIAGNOSIS — M85852 Other specified disorders of bone density and structure, left thigh: Secondary | ICD-10-CM | POA: Diagnosis not present

## 2015-06-21 DIAGNOSIS — M85852 Other specified disorders of bone density and structure, left thigh: Secondary | ICD-10-CM | POA: Diagnosis not present

## 2016-05-08 DIAGNOSIS — J209 Acute bronchitis, unspecified: Secondary | ICD-10-CM | POA: Diagnosis not present

## 2016-05-08 DIAGNOSIS — B309 Viral conjunctivitis, unspecified: Secondary | ICD-10-CM | POA: Diagnosis not present

## 2016-05-10 DIAGNOSIS — Z0000 Encounter for general adult medical examination without abnormal findings: Secondary | ICD-10-CM | POA: Diagnosis not present

## 2016-05-10 DIAGNOSIS — Z1211 Encounter for screening for malignant neoplasm of colon: Secondary | ICD-10-CM | POA: Diagnosis not present

## 2016-05-10 DIAGNOSIS — M899 Disorder of bone, unspecified: Secondary | ICD-10-CM | POA: Diagnosis not present

## 2016-05-10 DIAGNOSIS — E785 Hyperlipidemia, unspecified: Secondary | ICD-10-CM | POA: Diagnosis not present

## 2016-05-10 DIAGNOSIS — Z1389 Encounter for screening for other disorder: Secondary | ICD-10-CM | POA: Diagnosis not present

## 2016-05-10 DIAGNOSIS — M85852 Other specified disorders of bone density and structure, left thigh: Secondary | ICD-10-CM | POA: Diagnosis not present

## 2016-05-10 DIAGNOSIS — R7301 Impaired fasting glucose: Secondary | ICD-10-CM | POA: Diagnosis not present

## 2016-05-10 DIAGNOSIS — I10 Essential (primary) hypertension: Secondary | ICD-10-CM | POA: Diagnosis not present

## 2016-05-15 ENCOUNTER — Encounter: Payer: Self-pay | Admitting: Internal Medicine

## 2016-05-15 DIAGNOSIS — Z23 Encounter for immunization: Secondary | ICD-10-CM | POA: Diagnosis not present

## 2016-05-15 DIAGNOSIS — N3946 Mixed incontinence: Secondary | ICD-10-CM | POA: Diagnosis not present

## 2016-05-15 DIAGNOSIS — Z1211 Encounter for screening for malignant neoplasm of colon: Secondary | ICD-10-CM | POA: Diagnosis not present

## 2016-05-15 DIAGNOSIS — M899 Disorder of bone, unspecified: Secondary | ICD-10-CM | POA: Diagnosis not present

## 2016-05-15 DIAGNOSIS — I10 Essential (primary) hypertension: Secondary | ICD-10-CM | POA: Diagnosis not present

## 2016-05-15 DIAGNOSIS — R05 Cough: Secondary | ICD-10-CM | POA: Diagnosis not present

## 2016-05-15 DIAGNOSIS — R7301 Impaired fasting glucose: Secondary | ICD-10-CM | POA: Diagnosis not present

## 2016-05-15 DIAGNOSIS — E785 Hyperlipidemia, unspecified: Secondary | ICD-10-CM | POA: Diagnosis not present

## 2016-05-15 DIAGNOSIS — J309 Allergic rhinitis, unspecified: Secondary | ICD-10-CM | POA: Diagnosis not present

## 2016-05-15 DIAGNOSIS — Z0000 Encounter for general adult medical examination without abnormal findings: Secondary | ICD-10-CM | POA: Diagnosis not present

## 2016-05-15 DIAGNOSIS — Z1389 Encounter for screening for other disorder: Secondary | ICD-10-CM | POA: Diagnosis not present

## 2016-07-17 ENCOUNTER — Ambulatory Visit (AMBULATORY_SURGERY_CENTER): Payer: Self-pay | Admitting: *Deleted

## 2016-07-17 VITALS — Ht 64.5 in | Wt 163.0 lb

## 2016-07-17 DIAGNOSIS — Z1211 Encounter for screening for malignant neoplasm of colon: Secondary | ICD-10-CM

## 2016-07-17 NOTE — Progress Notes (Signed)
Patient denies any allergies to eggs or soy. Patient denies any problems with anesthesia/sedation. Patient denies any oxygen use at home and does not take any diet/weight loss medications. EMMI education declined by patient.  

## 2016-07-31 ENCOUNTER — Ambulatory Visit (AMBULATORY_SURGERY_CENTER): Payer: Medicare Other | Admitting: Internal Medicine

## 2016-07-31 ENCOUNTER — Encounter: Payer: Self-pay | Admitting: Internal Medicine

## 2016-07-31 VITALS — BP 104/58 | HR 78 | Temp 98.6°F | Resp 20 | Ht 64.0 in | Wt 163.0 lb

## 2016-07-31 DIAGNOSIS — Z1212 Encounter for screening for malignant neoplasm of rectum: Secondary | ICD-10-CM | POA: Diagnosis not present

## 2016-07-31 DIAGNOSIS — Z1211 Encounter for screening for malignant neoplasm of colon: Secondary | ICD-10-CM

## 2016-07-31 DIAGNOSIS — D120 Benign neoplasm of cecum: Secondary | ICD-10-CM | POA: Diagnosis not present

## 2016-07-31 DIAGNOSIS — K635 Polyp of colon: Secondary | ICD-10-CM | POA: Diagnosis not present

## 2016-07-31 MED ORDER — SODIUM CHLORIDE 0.9 % IV SOLN: 500.0000 mL | INTRAVENOUS | Status: AC

## 2016-07-31 NOTE — Op Note (Signed)
[place] Patient Name: Gloristine Kosta Procedure Date: 07/31/2016 10:33 AM MRN: [id number] Endoscopist: Gatha Mayer , MD Age: 76 Referring MD:  Date of Birth: 1941/07/07 Gender: Female Account #: [id number] Procedure:                Colonoscopy Indications:              Screening for colorectal malignant neoplasm, Last                            colonoscopy: 2006 Medicines:                Propofol per Anesthesia, Monitored Anesthesia Care Procedure:                Pre-Anesthesia Assessment:                           - Prior to the procedure, a History and Physical                            was performed, and patient medications and                            allergies were reviewed. The patient's tolerance of                            previous anesthesia was also reviewed. The risks                            and benefits of the procedure and the sedation                            options and risks were discussed with the patient.                            All questions were answered, and informed consent                            was obtained. Prior Anticoagulants: The patient has                            taken no previous anticoagulant or antiplatelet                            agents. ASA Grade Assessment: II - A patient with                            mild systemic disease. After reviewing the risks                            and benefits, the patient was deemed in                            satisfactory condition to undergo the procedure.  After obtaining informed consent, the colonoscope                            was passed under direct vision. Throughout the                            procedure, the patient's blood pressure, pulse, and                            oxygen saturations were monitored continuously. The                            Colonoscope was introduced through the anus and                            advanced to the  the cecum, identified by                            appendiceal orifice and ileocecal valve. The                            colonoscopy was performed without difficulty. The                            patient tolerated the procedure well. The quality                            of the bowel preparation was excellent. The                            ileocecal valve, appendiceal orifice, and rectum                            were photographed. Scope In: 10:47:14 AM Scope Out: 11:02:52 AM Scope Withdrawal Time: 0 hours 10 minutes 40 seconds  Total Procedure Duration: 0 hours 15 minutes 38 seconds  Findings:                 The perianal and digital rectal examinations were                            normal.                           A 2 mm polyp was found in the cecum. The polyp was                            sessile. The polyp was removed with a cold biopsy                            forceps. Resection and retrieval were complete.                            Verification of patient identification for the  specimen was done. Estimated blood loss was minimal.                           A few small and large-mouthed diverticula were                            found in the sigmoid colon.                           External and internal hemorrhoids were found during                            retroflexion.                           The exam was otherwise without abnormality on                            direct and retroflexion views. Complications:            No immediate complications. Estimated Blood Loss:     Estimated blood loss was minimal. Impression:               - One 2 mm polyp in the cecum, removed with a cold                            biopsy forceps. Resected and retrieved.                           - Diverticulosis in the sigmoid colon.                           - External and internal hemorrhoids.                           - The examination was otherwise  normal on direct                            and retroflexion views. Recommendation:           - Patient has a contact number available for                            emergencies. The signs and symptoms of potential                            delayed complications were discussed with the                            patient. Return to normal activities tomorrow.                            Written discharge instructions were provided to the                            patient.                           -  Resume previous diet.                           - No repeat colonoscopy due to age.                           - Continue present medications. Gatha Mayer, MD 07/31/2016 11:11:15 AM This report has been signed electronically.

## 2016-07-31 NOTE — Patient Instructions (Addendum)
I found and removed one tiny polyp that looks benign. I will let you know what it is after pathology returns. I doubt you will need another routine colonoscopy.  I appreciate the opportunity to care for you.  Gatha Mayer, MD, FACG YOU HAD AN ENDOSCOPIC PROCEDURE TODAY AT [facility]:   Refer to the procedure report that was given to you for any specific questions about what was found during the examination.  If the procedure report does not answer your questions, please call your gastroenterologist to clarify.  If you requested that your care partner not be given the details of your procedure findings, then the procedure report has been included in a sealed envelope for you to review at your convenience later.  YOU SHOULD EXPECT: Some feelings of bloating in the abdomen. Passage of more gas than usual.  Walking can help get rid of the air that was put into your GI tract during the procedure and reduce the bloating. If you had a lower endoscopy (such as a colonoscopy or flexible sigmoidoscopy) you may notice spotting of blood in your stool or on the toilet paper. If you underwent a bowel prep for your procedure, you may not have a normal bowel movement for a few days.  Please Note:  You might notice some irritation and congestion in your nose or some drainage.  This is from the oxygen used during your procedure.  There is no need for concern and it should clear up in a day or so.  SYMPTOMS TO REPORT IMMEDIATELY:   Following lower endoscopy (colonoscopy or flexible sigmoidoscopy):  Excessive amounts of blood in the stool  Significant tenderness or worsening of abdominal pains  Swelling of the abdomen that is new, acute  Fever of 100F or higher  For urgent or emergent issues, a gastroenterologist can be reached at any hour by calling [phone].   DIET:  We do recommend a small meal at first, but then you may proceed to your regular diet.  Drink plenty of  fluids but you should avoid alcoholic beverages for 24 hours.  ACTIVITY:  You should plan to take it easy for the rest of today and you should NOT DRIVE or use heavy machinery until tomorrow (because of the sedation medicines used during the test).    FOLLOW UP: Our staff will call the number listed on your records the next business day following your procedure to check on you and address any questions or concerns that you may have regarding the information given to you following your procedure. If we do not reach you, we will leave a message.  However, if you are feeling well and you are not experiencing any problems, there is no need to return our call.  We will assume that you have returned to your regular daily activities without incident.  If any biopsies were taken you will be contacted by phone or by letter within the next 1-3 weeks.  Please call us at [phone] if you have not heard about the biopsies in 3 weeks.   No repeat Colonoscopy due to age Diverticulosis (handout given) Hemorrhoids (handout given) Polyps (handout given) Await biopsy results  SIGNATURES/CONFIDENTIALITY: You and/or your care partner have signed paperwork which will be entered into your electronic medical record.  These signatures attest to the fact that that the information above on your After Visit Summary has been reviewed and is understood.  Full responsibility of the confidentiality of this discharge information  lies with you and/or your care-partner.

## 2016-07-31 NOTE — Progress Notes (Signed)
Called to room to assist during endoscopic procedure.  Patient ID and intended procedure confirmed with present staff. Received instructions for my participation in the procedure from the performing physician.  

## 2016-08-01 ENCOUNTER — Telehealth: Payer: Self-pay | Admitting: *Deleted

## 2016-08-01 NOTE — Telephone Encounter (Signed)
  Follow up Call-  Call back number 07/31/2016  Post procedure Call Back phone  # [phone]  Permission to leave phone message Yes  Some recent data might be hidden     Patient questions:  Do you have a fever, pain , or abdominal swelling? No. Pain Score  0 *  Have you tolerated food without any problems? Yes.    Have you been able to return to your normal activities? Yes.    Do you have any questions about your discharge instructions: Diet   No. Medications  No. Follow up visit  No.  Do you have questions or concerns about your Care? No.  Actions: * If pain score is 4 or above: No action needed, pain <4.

## 2016-08-06 ENCOUNTER — Encounter: Payer: Self-pay | Admitting: Internal Medicine

## 2016-08-06 NOTE — Progress Notes (Signed)
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

## 2016-11-18 DIAGNOSIS — I10 Essential (primary) hypertension: Secondary | ICD-10-CM | POA: Diagnosis not present

## 2016-11-18 DIAGNOSIS — J309 Allergic rhinitis, unspecified: Secondary | ICD-10-CM | POA: Diagnosis not present

## 2016-11-18 DIAGNOSIS — R05 Cough: Secondary | ICD-10-CM | POA: Diagnosis not present

## 2016-11-18 DIAGNOSIS — E785 Hyperlipidemia, unspecified: Secondary | ICD-10-CM | POA: Diagnosis not present

## 2016-11-18 DIAGNOSIS — R000 Tachycardia, unspecified: Secondary | ICD-10-CM | POA: Diagnosis not present

## 2016-11-18 DIAGNOSIS — R7301 Impaired fasting glucose: Secondary | ICD-10-CM | POA: Diagnosis not present

## 2016-11-18 DIAGNOSIS — J310 Chronic rhinitis: Secondary | ICD-10-CM | POA: Diagnosis not present

## 2016-12-11 DIAGNOSIS — J301 Allergic rhinitis due to pollen: Secondary | ICD-10-CM | POA: Diagnosis not present

## 2016-12-11 DIAGNOSIS — I10 Essential (primary) hypertension: Secondary | ICD-10-CM | POA: Diagnosis not present

## 2016-12-11 DIAGNOSIS — R05 Cough: Secondary | ICD-10-CM | POA: Diagnosis not present

## 2017-04-07 DIAGNOSIS — Z23 Encounter for immunization: Secondary | ICD-10-CM | POA: Diagnosis not present

## 2017-05-20 DIAGNOSIS — I10 Essential (primary) hypertension: Secondary | ICD-10-CM | POA: Diagnosis not present

## 2017-05-20 DIAGNOSIS — Z1389 Encounter for screening for other disorder: Secondary | ICD-10-CM | POA: Diagnosis not present

## 2017-05-20 DIAGNOSIS — R000 Tachycardia, unspecified: Secondary | ICD-10-CM | POA: Diagnosis not present

## 2017-05-20 DIAGNOSIS — R7301 Impaired fasting glucose: Secondary | ICD-10-CM | POA: Diagnosis not present

## 2017-05-20 DIAGNOSIS — M85852 Other specified disorders of bone density and structure, left thigh: Secondary | ICD-10-CM | POA: Diagnosis not present

## 2017-05-20 DIAGNOSIS — R05 Cough: Secondary | ICD-10-CM | POA: Diagnosis not present

## 2017-05-20 DIAGNOSIS — E785 Hyperlipidemia, unspecified: Secondary | ICD-10-CM | POA: Diagnosis not present

## 2017-05-20 DIAGNOSIS — Z0001 Encounter for general adult medical examination with abnormal findings: Secondary | ICD-10-CM | POA: Diagnosis not present

## 2017-05-20 DIAGNOSIS — J310 Chronic rhinitis: Secondary | ICD-10-CM | POA: Diagnosis not present

## 2017-05-22 ENCOUNTER — Other Ambulatory Visit: Payer: Self-pay | Admitting: *Deleted

## 2017-05-22 ENCOUNTER — Encounter: Payer: Self-pay | Admitting: *Deleted

## 2017-05-22 NOTE — Telephone Encounter (Signed)
Referral request sent to Scheduling from [facility], Cari Caraway

## 2017-05-23 ENCOUNTER — Encounter: Payer: Self-pay | Admitting: Cardiovascular Disease

## 2017-05-26 ENCOUNTER — Ambulatory Visit (INDEPENDENT_AMBULATORY_CARE_PROVIDER_SITE_OTHER): Payer: Medicare Other | Admitting: Cardiovascular Disease

## 2017-05-26 ENCOUNTER — Encounter: Payer: Self-pay | Admitting: Cardiovascular Disease

## 2017-05-26 ENCOUNTER — Encounter (INDEPENDENT_AMBULATORY_CARE_PROVIDER_SITE_OTHER): Payer: Self-pay

## 2017-05-26 VITALS — BP 128/70 | HR 110 | Ht 64.0 in | Wt 164.1 lb

## 2017-05-26 DIAGNOSIS — I10 Essential (primary) hypertension: Secondary | ICD-10-CM | POA: Diagnosis not present

## 2017-05-26 DIAGNOSIS — R000 Tachycardia, unspecified: Secondary | ICD-10-CM

## 2017-05-26 MED ORDER — NEBIVOLOL HCL 5 MG PO TABS
5.0000 mg | ORAL_TABLET | Freq: Every day | ORAL | 3 refills | Status: DC
Start: 2017-05-26 — End: 2018-05-19

## 2017-05-26 NOTE — Patient Instructions (Signed)
Medication Instructions:  Your physician has recommended you make the following change in your medication: STOP Amlodipine START Bystolic 5 mg once daily   Labwork: None Ordered   Testing/Procedures: None Ordered    Follow-Up: Your physician recommends that you schedule a follow-up appointment in: 2 months with Dr. Acie Fredrickson    If you need a refill on your cardiac medications before your next appointment, please call your pharmacy.   Thank you for choosing [facility]! Christen Bame, RN [phone]

## 2017-05-26 NOTE — Progress Notes (Signed)
Cardiology Office Note:    Date:  05/26/2017   ID:  Susan Hill, DOB 11/17/1940, MRN [id number]  PCP:  Cari Caraway, MD  Cardiologist:  Mertie Moores, MD    Referring MD: Cari Caraway, MD   Chief Complaint  Patient presents with  . Tachycardia    History of Present Illness:     Susan Hill is a 76 y.o. female who is being seen today for the evaluation of sinus tach  at the request of Cari Caraway, MD.  This is Susan Hill has a history of hypertension, hyperlipidemia, and borderline diabetes.  Susan Hill has a history of hypertension and hyperlipidemia.  She cannot tell that her heart rate is going fast. She is never had any episodes of chest pain or shortness of breath.  She has some sinus drainage issues. She had been doing water aerobics but has not exercised in a few months  No syncope or presyncope.  She denies any PND orthopnea. She tries to eat a low-salt diet.  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  Past Medical History:  Diagnosis Date  . Allergic rhinitis due to pollen   . Bone disorder   . Chronic cough   . Chronic rhinitis   . Hyperlipidemia   . Hypertension   . Impaired fasting glucose   . Osteopenia of left hip   . Tachycardia, unspecified     Past Surgical History:  Procedure Laterality Date  . DILATION AND CURETTAGE OF UTERUS    . TUBAL LIGATION      Current Medications: Current Meds  Medication Sig  . aspirin 81 MG tablet Take 81 mg by mouth every other day.   Marland Kitchen atorvastatin (LIPITOR) 10 MG tablet Take 10 mg by mouth daily.  . benazepril (LOTENSIN) 10 MG tablet Take 10 mg by mouth daily.  Marland Kitchen BIOTIN PO Take 1 tablet daily by mouth.   . Cholecalciferol (VITAMIN D-3 PO) Take 1 tablet daily by mouth.   . [DISCONTINUED] amLODipine (NORVASC) 5 MG tablet Take 5 mg by mouth daily.   Current Facility-Administered Medications for the 05/26/17 encounter (Office Visit) with Juvencio Verdi, Wonda Cheng, MD  Medication  . 0.9 %  sodium  chloride infusion     Allergies:   Aspirin and Sulfa antibiotics   Social History   Socioeconomic History  . Marital status: Married    Spouse name: None  . Number of children: None  . Years of education: None  . Highest education level: None  Social Needs  . Financial resource strain: None  . Food insecurity - worry: None  . Food insecurity - inability: None  . Transportation needs - medical: None  . Transportation needs - non-medical: None  Occupational History  . None  Tobacco Use  . Smoking status: Never Smoker  . Smokeless tobacco: Never Used  Substance and Sexual Activity  . Alcohol use: Yes    Alcohol/week: 3.0 oz    Types: 5 Glasses of wine per week    Comment: wine  . Drug use: No  . Sexual activity: None  Other Topics Concern  . None  Social History Narrative  . None     Family History: The patient's family history includes Arthritis in her sister; Asthma in her mother; CAD in her father, mother, paternal grandfather, and paternal grandmother; Colon cancer in her other; Heart failure in her mother; Hypertension in her mother, sister, and sister. ROS:   Please see the history of present illness.  All other systems reviewed and are negative.  EKGs/Labs/Other Studies Reviewed:    The following studies were reviewed today:     Recent Labs: No results found for requested labs within last 8760 hours.  Recent Lipid Panel No results found for: CHOL, TRIG, HDL, CHOLHDL, VLDL, LDLCALC, LDLDIRECT  Physical Exam:    VS:  BP 128/70   Pulse (!) 110   Ht 5\' 4"  (1.626 m)   Wt 164 lb 1.9 oz (74.4 kg)   SpO2 98%   BMI 28.17 kg/m     Wt Readings from Last 3 Encounters:  05/26/17 164 lb 1.9 oz (74.4 kg)  07/31/16 163 lb (73.9 kg)  07/17/16 163 lb (73.9 kg)     GEN:  Well nourished, well developed in no acute distress HEENT: Normal NECK: No JVD; No carotid bruits LYMPHATICS: No lymphadenopathy CARDIAC: RR,  Tachy , no murmurs, rubs,  gallops RESPIRATORY:  Clear to auscultation without rales, wheezing or rhonchi  ABDOMEN: Soft, non-tender, non-distended MUSCULOSKELETAL:  No edema; No deformity  SKIN: Warm and dry NEUROLOGIC:  Alert and oriented x 3 PSYCHIATRIC:  Normal affect   EKG:  Nov. 19, 2018:   Sinus tach at 111.  Low voltage   ASSESSMENT:    1. Sinus tachycardia   2. Essential hypertension    PLAN:    In order of problems listed above:  1. Sinus tachycardia:    Susan Hill presents with asymptomatic sinus tachycardia.  She really cannot tell that her heart rate is fast.  She is on both amlodipine and benazepril.  This could be a reflex tachycardia due to her vasodilatation from both the amlodipine and benazepril.  We will discontinue the amlodipine and start her on Bystolic 5 mg a day.  I will see her in 2 months for follow-up visit  2.  Hypertension: Continue benazepril.  We will start her on Bystolic 5 mg a day.  We are holding amlodipine.   Medication Adjustments/Labs and Tests Ordered: Current medicines are reviewed at length with the patient today.  Concerns regarding medicines are outlined above.  Orders Placed This Encounter  Procedures  . EKG 12-Lead   Meds ordered this encounter  Medications  . nebivolol (BYSTOLIC) 5 MG tablet    Sig: Take 1 tablet (5 mg total) daily by mouth.    Dispense:  90 tablet    Refill:  3    Signed, Mertie Moores, MD  05/26/2017 3:22 PM    [place]

## 2017-05-28 ENCOUNTER — Other Ambulatory Visit: Payer: Self-pay | Admitting: Family Medicine

## 2017-05-28 DIAGNOSIS — Z1231 Encounter for screening mammogram for malignant neoplasm of breast: Secondary | ICD-10-CM

## 2017-05-28 DIAGNOSIS — M8580 Other specified disorders of bone density and structure, unspecified site: Secondary | ICD-10-CM

## 2017-06-26 ENCOUNTER — Ambulatory Visit
Admission: RE | Admit: 2017-06-26 | Discharge: 2017-06-26 | Disposition: A | Discharge status: Home / Self Care | Payer: Medicare Other | Source: Ambulatory Visit | Attending: Family Medicine | Admitting: Family Medicine

## 2017-06-26 DIAGNOSIS — Z780 Asymptomatic menopausal state: Secondary | ICD-10-CM | POA: Diagnosis not present

## 2017-06-26 DIAGNOSIS — Z1231 Encounter for screening mammogram for malignant neoplasm of breast: Secondary | ICD-10-CM | POA: Diagnosis not present

## 2017-06-26 DIAGNOSIS — M8580 Other specified disorders of bone density and structure, unspecified site: Secondary | ICD-10-CM

## 2017-06-26 DIAGNOSIS — M8589 Other specified disorders of bone density and structure, multiple sites: Secondary | ICD-10-CM | POA: Diagnosis not present

## 2017-06-27 ENCOUNTER — Other Ambulatory Visit: Payer: Self-pay | Admitting: Family Medicine

## 2017-06-27 DIAGNOSIS — R928 Other abnormal and inconclusive findings on diagnostic imaging of breast: Secondary | ICD-10-CM

## 2017-07-04 ENCOUNTER — Ambulatory Visit: Payer: Medicare Other

## 2017-07-04 ENCOUNTER — Ambulatory Visit
Admission: RE | Admit: 2017-07-04 | Discharge: 2017-07-04 | Disposition: A | Discharge status: Home / Self Care | Payer: Medicare Other | Source: Ambulatory Visit | Attending: Family Medicine | Admitting: Family Medicine

## 2017-07-04 DIAGNOSIS — R928 Other abnormal and inconclusive findings on diagnostic imaging of breast: Secondary | ICD-10-CM

## 2017-07-29 ENCOUNTER — Telehealth: Payer: Self-pay | Admitting: Cardiovascular Disease

## 2017-07-29 NOTE — Telephone Encounter (Signed)
Susan Hill is calling because she is having some dizziness which she has never had before . She thinks it may be the Bystolic 5mg  which she is currently taking . States that she has been only taking it a little over a month, she started the medication on 06/01/17... Please call

## 2017-07-29 NOTE — Telephone Encounter (Signed)
Pt states she has been having intermittent dizziness x 1 week now.  Pt fell Friday at home when she went from sitting to standing.  Denies that she passed out.  Pt has not checked BP.  Denies any other sx.  Pt states she is no longer on Lotensin.  Pt currently taking Losartan 50mg  QD and Bystolic 5mg  BID.  Pt currently feels fine.  Advised pt to be very careful with position changes and to monitor BP about 2 hours after she takes in Bystolic and Losartan in the AM.  Advised pt to record these BP's and call back with those readings towards the end of the week so we can see if it is possibly Hypotension causing the dizziness.  Also advised I will send message to Dr. Acie Fredrickson for further recommendations.  Pt appreciative for call.

## 2017-07-30 NOTE — Telephone Encounter (Signed)
Follow up    Patient is calling back about Dr. Acie Fredrickson recommendations.

## 2017-07-30 NOTE — Telephone Encounter (Signed)
Spoke with patient's husband (per DPR) who states patient has been dizzy and unsteady on her feet since starting Bystolic in November. He states he looked at the side effects of bystolic and thinks her symptoms are caused by this medication.  I advised that the medication was prescribed for sinus tachycardia and asked about pulse readings; he states he has not been monitoring pulse. He states BP readings have been: 120/84 mmHg and 121/68 mmHg. I asked if patient is willing to try 1/2 dose of Bystolic (2.5 mg) for the next week and he verbalized agreement. I advised him to monitor HR and BP and to call back in 1 week to report. Patient has an appointment with Dr. Acie Fredrickson on 2/5. Husband verbalized understanding and agreement and thanked me for the call.

## 2017-07-30 NOTE — Telephone Encounter (Signed)
Agree with getting HR and BP info to help [place] make the decision. We can decrease the Losartan and or Bystolic if needed. If her Hr or BP are too low

## 2017-08-07 ENCOUNTER — Telehealth: Payer: Self-pay | Admitting: Cardiovascular Disease

## 2017-08-07 NOTE — Telephone Encounter (Signed)
Pt's husband to call back  if no improvement in pt's balance, dizziness, some improvement but not enough, pls call husband Eddie Dibbles @ [phone]

## 2017-08-07 NOTE — Telephone Encounter (Signed)
Agree with stopping Bystolic and see how she does

## 2017-08-07 NOTE — Telephone Encounter (Signed)
Spoke with patient's husband who states patient fell 12 days ago and says that she is doing better but continues to have balance issues. He states she walks with her hands held out in front of her for balance but if she is asked she will stay that she does not have any issues. He states her balance issues started at the time she started bystolic. She is currently taking 2.5 mg as discussed in telephone encounter dated 07/29/17. He states he feels it is best if she stops the medication. He states recent pulse readings have been 64-82 bpm. I advised that she may d/c bystolic and that he should continue to monitor BP and HR. He is aware of patient's appointment with Dr. Acie Fredrickson on 2/5. He states he wanted to make certain that we know that she will not report any problems with balance but that she still shows signs of imbalance at home. He states he is concerned about her short term memory which he is discussing with her PCP. I advised that I will make certain Dr. Acie Fredrickson is aware. He thanked me for my help.

## 2017-08-12 ENCOUNTER — Ambulatory Visit (INDEPENDENT_AMBULATORY_CARE_PROVIDER_SITE_OTHER): Payer: Medicare Other | Admitting: Cardiovascular Disease

## 2017-08-12 ENCOUNTER — Encounter: Payer: Self-pay | Admitting: Cardiovascular Disease

## 2017-08-12 ENCOUNTER — Encounter (INDEPENDENT_AMBULATORY_CARE_PROVIDER_SITE_OTHER): Payer: Self-pay

## 2017-08-12 VITALS — BP 136/80 | HR 96 | Ht 64.0 in | Wt 163.2 lb

## 2017-08-12 DIAGNOSIS — R2689 Other abnormalities of gait and mobility: Secondary | ICD-10-CM | POA: Diagnosis not present

## 2017-08-12 DIAGNOSIS — I10 Essential (primary) hypertension: Secondary | ICD-10-CM

## 2017-08-12 NOTE — Progress Notes (Signed)
Cardiology Office Note:    Date:  08/12/2017   ID:  Susan Hill, DOB 21-Dec-1940, MRN [id number]  PCP:  Susan Caraway, MD  Cardiologist:  Susan Moores, MD    Referring MD: Susan Caraway, MD   Chief Complaint  Patient presents with  . Follow-up    sinus tachycardia     History of Present Illness:     Susan Hill is a 77 y.o. female who is being seen today for the evaluation of sinus tach  at the request of Susan Caraway, MD.    [name] has a history of hypertension and hyperlipidemia.  She cannot tell that her heart rate is going fast. She is never had any episodes of chest pain or shortness of breath.  She has some sinus drainage issues. She had been doing water aerobics but has not exercised in a few months  No syncope or presyncope.  She denies any PND orthopnea. She tries to eat a low-salt diet.  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  Feb. 5, 2019:    Susan Hill is seen today for follow-up of her sinus tachycardia. Issues.  Her husband thinks that her balance issues started when she started taking Bystolic.  She cut the dose l in half but continues to have balance issues. She has fallen several times .    Has been taking the Bystolic 2.5 mg a day  Has continued to balance issues Husband relates that she has had balance and gait issues starting in Nov.     Past Medical History:  Diagnosis Date  . Allergic rhinitis due to pollen   . Bone disorder   . Chronic cough   . Chronic rhinitis   . Hyperlipidemia   . Hypertension   . Impaired fasting glucose   . Osteopenia of left hip   . Tachycardia, unspecified     Past Surgical History:  Procedure Laterality Date  . DILATION AND CURETTAGE OF UTERUS    . TUBAL LIGATION      Current Medications: Current Meds  Medication Sig  . aspirin 81 MG tablet Take 81 mg by mouth every other day.   Marland Kitchen atorvastatin (LIPITOR) 10 MG tablet Take 10 mg by mouth daily.  . Cholecalciferol (VITAMIN D-3  PO) Take 1 tablet daily by mouth.   . nebivolol (BYSTOLIC) 5 MG tablet Take 1 tablet (5 mg total) daily by mouth.   Current Facility-Administered Medications for the 08/12/17 encounter (Office Visit) with Susan Hill, Susan Cheng, MD  Medication  . 0.9 %  sodium chloride infusion     Allergies:   Aspirin and Sulfa antibiotics   Social History   Socioeconomic History  . Marital status: Married    Spouse name: None  . Number of children: None  . Years of education: None  . Highest education level: None  Social Needs  . Financial resource strain: None  . Food insecurity - worry: None  . Food insecurity - inability: None  . Transportation needs - medical: None  . Transportation needs - non-medical: None  Occupational History  . None  Tobacco Use  . Smoking status: Never Smoker  . Smokeless tobacco: Never Used  Substance and Sexual Activity  . Alcohol use: Yes    Alcohol/week: 3.0 oz    Types: 5 Glasses of wine per week    Comment: wine  . Drug use: No  . Sexual activity: None  Other Topics Concern  . None  Social History Narrative  .  None     Family History: The patient's family history includes Arthritis in her sister; Asthma in her mother; CAD in her father, mother, paternal grandfather, and paternal grandmother; Colon cancer in her other; Heart failure in her mother; Hypertension in her mother, sister, and sister. ROS:   Please see the history of present illness.    All other systems reviewed and are negative.  EKGs/Labs/Other Studies Reviewed:    The following studies were reviewed today:  Recent Labs: No results found for requested labs within last 8760 hours.  Recent Lipid Panel No results found for: CHOL, TRIG, HDL, CHOLHDL, VLDL, LDLCALC, LDLDIRECT  Physical Exam: Blood pressure 136/80, pulse 96, height 5\' 4"  (1.626 m), weight 163 lb 3.2 oz (74 kg), SpO2 97 %.  GEN:  Elderly famale HEENT: Normal NECK: No JVD; No carotid bruits LYMPHATICS: No  lymphadenopathy CARDIAC: RR, no murmurs, rubs, gallops RESPIRATORY:  Clear to auscultation without rales, wheezing or rhonchi  ABDOMEN: Soft, non-tender, non-distended MUSCULOSKELETAL:  No edema; No deformity  SKIN: Warm and dry NEUROLOGIC:  Lack of balance.   Had her walk across the hall,   She needed lots of assistance ( holding onto counters , walls)  Was able to do a step test but very slowly    EKG:     ASSESSMENT / Plan :     1.  Sinus tachycardia:    Her sinus tachycardia is better following the addition of Bystolic 2.5 mg a day.  She is not orthostatic. Lying - 139/76    HR 70 sittig  -  143/83    HR 71 Standing   136/82   HR 82  Continue Bystolic 2.5 a day   She would like to follow up with Dr Susan Hill for her HTN and tachycardia and will call us as needed.   2.  Hypertension: currently well controlled  3.  Loss of balance :  She has very poor balance.   Had to hold onto counters and walls when walking about 20-30 feet.   This was not related to a drop in blood pressure.  I think that she needs a neurologic evaluation.  Possible causes include posterior stroke or other neuromuscular disorder.  She will follow-up with her primary medical doctor.   Medication Adjustments/Labs and Tests Ordered: Current medicines are reviewed at length with the patient today.  Concerns regarding medicines are outlined above.  No orders of the defined types were placed in this encounter.  No orders of the defined types were placed in this encounter.   Signed, Susan Moores, MD  08/12/2017 1:32 PM    [facility]

## 2017-08-12 NOTE — Patient Instructions (Signed)

## 2017-08-14 DIAGNOSIS — R2689 Other abnormalities of gait and mobility: Secondary | ICD-10-CM | POA: Diagnosis not present

## 2017-08-14 DIAGNOSIS — I10 Essential (primary) hypertension: Secondary | ICD-10-CM | POA: Diagnosis not present

## 2017-08-14 DIAGNOSIS — R269 Unspecified abnormalities of gait and mobility: Secondary | ICD-10-CM | POA: Diagnosis not present

## 2017-08-18 DIAGNOSIS — M545 Low back pain: Secondary | ICD-10-CM | POA: Diagnosis not present

## 2017-08-18 DIAGNOSIS — M6281 Muscle weakness (generalized): Secondary | ICD-10-CM | POA: Diagnosis not present

## 2017-08-18 DIAGNOSIS — R262 Difficulty in walking, not elsewhere classified: Secondary | ICD-10-CM | POA: Diagnosis not present

## 2017-08-18 DIAGNOSIS — R2681 Unsteadiness on feet: Secondary | ICD-10-CM | POA: Diagnosis not present

## 2017-08-18 DIAGNOSIS — E538 Deficiency of other specified B group vitamins: Secondary | ICD-10-CM | POA: Diagnosis not present

## 2017-08-19 DIAGNOSIS — R2681 Unsteadiness on feet: Secondary | ICD-10-CM | POA: Diagnosis not present

## 2017-08-19 DIAGNOSIS — R262 Difficulty in walking, not elsewhere classified: Secondary | ICD-10-CM | POA: Diagnosis not present

## 2017-08-19 DIAGNOSIS — M6281 Muscle weakness (generalized): Secondary | ICD-10-CM | POA: Diagnosis not present

## 2017-08-19 DIAGNOSIS — M545 Low back pain: Secondary | ICD-10-CM | POA: Diagnosis not present

## 2017-08-22 DIAGNOSIS — R2681 Unsteadiness on feet: Secondary | ICD-10-CM | POA: Diagnosis not present

## 2017-08-22 DIAGNOSIS — M6281 Muscle weakness (generalized): Secondary | ICD-10-CM | POA: Diagnosis not present

## 2017-08-22 DIAGNOSIS — R262 Difficulty in walking, not elsewhere classified: Secondary | ICD-10-CM | POA: Diagnosis not present

## 2017-08-22 DIAGNOSIS — M545 Low back pain: Secondary | ICD-10-CM | POA: Diagnosis not present

## 2017-08-25 DIAGNOSIS — E538 Deficiency of other specified B group vitamins: Secondary | ICD-10-CM | POA: Diagnosis not present

## 2017-08-26 DIAGNOSIS — R2681 Unsteadiness on feet: Secondary | ICD-10-CM | POA: Diagnosis not present

## 2017-08-26 DIAGNOSIS — M545 Low back pain: Secondary | ICD-10-CM | POA: Diagnosis not present

## 2017-08-26 DIAGNOSIS — M6281 Muscle weakness (generalized): Secondary | ICD-10-CM | POA: Diagnosis not present

## 2017-08-26 DIAGNOSIS — R262 Difficulty in walking, not elsewhere classified: Secondary | ICD-10-CM | POA: Diagnosis not present

## 2017-08-27 DIAGNOSIS — R262 Difficulty in walking, not elsewhere classified: Secondary | ICD-10-CM | POA: Diagnosis not present

## 2017-08-27 DIAGNOSIS — M6281 Muscle weakness (generalized): Secondary | ICD-10-CM | POA: Diagnosis not present

## 2017-08-27 DIAGNOSIS — R2681 Unsteadiness on feet: Secondary | ICD-10-CM | POA: Diagnosis not present

## 2017-08-27 DIAGNOSIS — M545 Low back pain: Secondary | ICD-10-CM | POA: Diagnosis not present

## 2017-08-29 DIAGNOSIS — M6281 Muscle weakness (generalized): Secondary | ICD-10-CM | POA: Diagnosis not present

## 2017-08-29 DIAGNOSIS — R2681 Unsteadiness on feet: Secondary | ICD-10-CM | POA: Diagnosis not present

## 2017-08-29 DIAGNOSIS — R262 Difficulty in walking, not elsewhere classified: Secondary | ICD-10-CM | POA: Diagnosis not present

## 2017-08-29 DIAGNOSIS — M545 Low back pain: Secondary | ICD-10-CM | POA: Diagnosis not present

## 2017-09-01 DIAGNOSIS — E538 Deficiency of other specified B group vitamins: Secondary | ICD-10-CM | POA: Diagnosis not present

## 2017-09-02 DIAGNOSIS — M545 Low back pain: Secondary | ICD-10-CM | POA: Diagnosis not present

## 2017-09-02 DIAGNOSIS — R2681 Unsteadiness on feet: Secondary | ICD-10-CM | POA: Diagnosis not present

## 2017-09-02 DIAGNOSIS — R262 Difficulty in walking, not elsewhere classified: Secondary | ICD-10-CM | POA: Diagnosis not present

## 2017-09-02 DIAGNOSIS — M6281 Muscle weakness (generalized): Secondary | ICD-10-CM | POA: Diagnosis not present

## 2017-09-03 DIAGNOSIS — M545 Low back pain: Secondary | ICD-10-CM | POA: Diagnosis not present

## 2017-09-03 DIAGNOSIS — R2681 Unsteadiness on feet: Secondary | ICD-10-CM | POA: Diagnosis not present

## 2017-09-03 DIAGNOSIS — M6281 Muscle weakness (generalized): Secondary | ICD-10-CM | POA: Diagnosis not present

## 2017-09-03 DIAGNOSIS — R262 Difficulty in walking, not elsewhere classified: Secondary | ICD-10-CM | POA: Diagnosis not present

## 2017-09-05 DIAGNOSIS — R262 Difficulty in walking, not elsewhere classified: Secondary | ICD-10-CM | POA: Diagnosis not present

## 2017-09-05 DIAGNOSIS — M545 Low back pain: Secondary | ICD-10-CM | POA: Diagnosis not present

## 2017-09-05 DIAGNOSIS — R2681 Unsteadiness on feet: Secondary | ICD-10-CM | POA: Diagnosis not present

## 2017-09-05 DIAGNOSIS — M6281 Muscle weakness (generalized): Secondary | ICD-10-CM | POA: Diagnosis not present

## 2017-09-08 DIAGNOSIS — R2681 Unsteadiness on feet: Secondary | ICD-10-CM | POA: Diagnosis not present

## 2017-09-08 DIAGNOSIS — R262 Difficulty in walking, not elsewhere classified: Secondary | ICD-10-CM | POA: Diagnosis not present

## 2017-09-08 DIAGNOSIS — M545 Low back pain: Secondary | ICD-10-CM | POA: Diagnosis not present

## 2017-09-08 DIAGNOSIS — E538 Deficiency of other specified B group vitamins: Secondary | ICD-10-CM | POA: Diagnosis not present

## 2017-09-08 DIAGNOSIS — M6281 Muscle weakness (generalized): Secondary | ICD-10-CM | POA: Diagnosis not present

## 2017-09-11 DIAGNOSIS — M6281 Muscle weakness (generalized): Secondary | ICD-10-CM | POA: Diagnosis not present

## 2017-09-11 DIAGNOSIS — R262 Difficulty in walking, not elsewhere classified: Secondary | ICD-10-CM | POA: Diagnosis not present

## 2017-09-11 DIAGNOSIS — R2681 Unsteadiness on feet: Secondary | ICD-10-CM | POA: Diagnosis not present

## 2017-09-11 DIAGNOSIS — M545 Low back pain: Secondary | ICD-10-CM | POA: Diagnosis not present

## 2017-09-12 DIAGNOSIS — M545 Low back pain: Secondary | ICD-10-CM | POA: Diagnosis not present

## 2017-09-12 DIAGNOSIS — R262 Difficulty in walking, not elsewhere classified: Secondary | ICD-10-CM | POA: Diagnosis not present

## 2017-09-12 DIAGNOSIS — R2681 Unsteadiness on feet: Secondary | ICD-10-CM | POA: Diagnosis not present

## 2017-09-12 DIAGNOSIS — M6281 Muscle weakness (generalized): Secondary | ICD-10-CM | POA: Diagnosis not present

## 2017-09-16 DIAGNOSIS — M6281 Muscle weakness (generalized): Secondary | ICD-10-CM | POA: Diagnosis not present

## 2017-09-16 DIAGNOSIS — R262 Difficulty in walking, not elsewhere classified: Secondary | ICD-10-CM | POA: Diagnosis not present

## 2017-09-16 DIAGNOSIS — M545 Low back pain: Secondary | ICD-10-CM | POA: Diagnosis not present

## 2017-09-16 DIAGNOSIS — R2681 Unsteadiness on feet: Secondary | ICD-10-CM | POA: Diagnosis not present

## 2017-09-18 DIAGNOSIS — M6281 Muscle weakness (generalized): Secondary | ICD-10-CM | POA: Diagnosis not present

## 2017-09-18 DIAGNOSIS — R262 Difficulty in walking, not elsewhere classified: Secondary | ICD-10-CM | POA: Diagnosis not present

## 2017-09-18 DIAGNOSIS — M545 Low back pain: Secondary | ICD-10-CM | POA: Diagnosis not present

## 2017-09-18 DIAGNOSIS — R2681 Unsteadiness on feet: Secondary | ICD-10-CM | POA: Diagnosis not present

## 2017-09-19 DIAGNOSIS — R2681 Unsteadiness on feet: Secondary | ICD-10-CM | POA: Diagnosis not present

## 2017-09-19 DIAGNOSIS — M6281 Muscle weakness (generalized): Secondary | ICD-10-CM | POA: Diagnosis not present

## 2017-09-19 DIAGNOSIS — R262 Difficulty in walking, not elsewhere classified: Secondary | ICD-10-CM | POA: Diagnosis not present

## 2017-09-19 DIAGNOSIS — M545 Low back pain: Secondary | ICD-10-CM | POA: Diagnosis not present

## 2017-09-22 DIAGNOSIS — E538 Deficiency of other specified B group vitamins: Secondary | ICD-10-CM | POA: Diagnosis not present

## 2017-09-23 DIAGNOSIS — R2681 Unsteadiness on feet: Secondary | ICD-10-CM | POA: Diagnosis not present

## 2017-09-23 DIAGNOSIS — M545 Low back pain: Secondary | ICD-10-CM | POA: Diagnosis not present

## 2017-09-23 DIAGNOSIS — R262 Difficulty in walking, not elsewhere classified: Secondary | ICD-10-CM | POA: Diagnosis not present

## 2017-09-23 DIAGNOSIS — M6281 Muscle weakness (generalized): Secondary | ICD-10-CM | POA: Diagnosis not present

## 2017-09-24 DIAGNOSIS — M6281 Muscle weakness (generalized): Secondary | ICD-10-CM | POA: Diagnosis not present

## 2017-09-24 DIAGNOSIS — R2681 Unsteadiness on feet: Secondary | ICD-10-CM | POA: Diagnosis not present

## 2017-09-24 DIAGNOSIS — M545 Low back pain: Secondary | ICD-10-CM | POA: Diagnosis not present

## 2017-09-24 DIAGNOSIS — R262 Difficulty in walking, not elsewhere classified: Secondary | ICD-10-CM | POA: Diagnosis not present

## 2017-09-26 DIAGNOSIS — M6281 Muscle weakness (generalized): Secondary | ICD-10-CM | POA: Diagnosis not present

## 2017-09-26 DIAGNOSIS — R2681 Unsteadiness on feet: Secondary | ICD-10-CM | POA: Diagnosis not present

## 2017-09-26 DIAGNOSIS — R262 Difficulty in walking, not elsewhere classified: Secondary | ICD-10-CM | POA: Diagnosis not present

## 2017-09-26 DIAGNOSIS — M545 Low back pain: Secondary | ICD-10-CM | POA: Diagnosis not present

## 2017-09-29 DIAGNOSIS — E538 Deficiency of other specified B group vitamins: Secondary | ICD-10-CM | POA: Diagnosis not present

## 2017-09-30 DIAGNOSIS — R262 Difficulty in walking, not elsewhere classified: Secondary | ICD-10-CM | POA: Diagnosis not present

## 2017-09-30 DIAGNOSIS — M545 Low back pain: Secondary | ICD-10-CM | POA: Diagnosis not present

## 2017-09-30 DIAGNOSIS — M6281 Muscle weakness (generalized): Secondary | ICD-10-CM | POA: Diagnosis not present

## 2017-09-30 DIAGNOSIS — R2681 Unsteadiness on feet: Secondary | ICD-10-CM | POA: Diagnosis not present

## 2017-10-03 DIAGNOSIS — R262 Difficulty in walking, not elsewhere classified: Secondary | ICD-10-CM | POA: Diagnosis not present

## 2017-10-03 DIAGNOSIS — M6281 Muscle weakness (generalized): Secondary | ICD-10-CM | POA: Diagnosis not present

## 2017-10-03 DIAGNOSIS — R2681 Unsteadiness on feet: Secondary | ICD-10-CM | POA: Diagnosis not present

## 2017-10-03 DIAGNOSIS — M545 Low back pain: Secondary | ICD-10-CM | POA: Diagnosis not present

## 2017-10-06 DIAGNOSIS — E538 Deficiency of other specified B group vitamins: Secondary | ICD-10-CM | POA: Diagnosis not present

## 2017-10-06 DIAGNOSIS — R2681 Unsteadiness on feet: Secondary | ICD-10-CM | POA: Diagnosis not present

## 2017-10-06 DIAGNOSIS — R262 Difficulty in walking, not elsewhere classified: Secondary | ICD-10-CM | POA: Diagnosis not present

## 2017-10-06 DIAGNOSIS — M545 Low back pain: Secondary | ICD-10-CM | POA: Diagnosis not present

## 2017-10-06 DIAGNOSIS — M6281 Muscle weakness (generalized): Secondary | ICD-10-CM | POA: Diagnosis not present

## 2017-10-08 ENCOUNTER — Encounter: Payer: Self-pay | Admitting: Neurology

## 2017-10-08 ENCOUNTER — Telehealth: Payer: Self-pay | Admitting: Neurology

## 2017-10-08 ENCOUNTER — Ambulatory Visit (INDEPENDENT_AMBULATORY_CARE_PROVIDER_SITE_OTHER): Payer: Medicare Other | Admitting: Neurology

## 2017-10-08 ENCOUNTER — Other Ambulatory Visit: Payer: Self-pay

## 2017-10-08 VITALS — BP 145/73 | HR 71 | Ht 64.0 in | Wt 157.0 lb

## 2017-10-08 DIAGNOSIS — R269 Unspecified abnormalities of gait and mobility: Secondary | ICD-10-CM

## 2017-10-08 DIAGNOSIS — R413 Other amnesia: Secondary | ICD-10-CM

## 2017-10-08 NOTE — Telephone Encounter (Signed)
Medicare/bcbs Josem Kaufmann: [id number] (exp. 10/08/17 to 11/06/17) order sent to [facility] they will reach out to pt to schedule.

## 2017-10-08 NOTE — Progress Notes (Signed)
Reason for visit: Gait disorder, memory disorder  Referring physician: Dr. Ralene Ok Susan Hill is a 77 y.o. female  History of present illness:  Susan Hill is a 77 year old right-handed white female with a history of hypertension and dyslipidemia.  The patient comes into the office today with her husband.  The husband indicates that there was a very sudden change in her ability to ambulate in November 2018.  From 1 day to the next the patient went from being able to walk normally to having significant issues with balance.  She normally walks with her husband about the neighborhood, and the husband suddenly noted that the patient was unable to walk the usual distance and had to hold on to him in order to get around.  She has continued to have gait instability, she has to touch a table or a wall to move about the house.  She has had a fall in January 2019 when she tried to get up out of bed to go the bathroom and fell.  She hurt her back, but her low back is now improved.  She denies any neck pain.  She denies any numbness or weakness of the face, arms, or legs.  She denies any issues controlling the bowels or the bladder.  She has been noted to have a B12 deficiency, she has been on B12 shots and will be transitioning to oral B12 supplementation.  Her husband is also concerned that she has developed a memory disorder, the patient denies this.  The patient denies any difficulty with directions with driving but her husband has not let her operate a motor vehicle since January 2019.  The patient no longer does the finances, but she is able to keep up with medications and appointments quite well.  She denies any headaches or dizziness or any blackout episodes.  She has been through physical therapy for gait training.  She does not use a walker or a cane for ambulation.  She is sent to this office for further evaluation.  Past Medical History:  Diagnosis Date  . Allergic rhinitis due to pollen     . Bone disorder   . Chronic cough   . Chronic rhinitis   . Hyperlipidemia   . Hypertension   . Impaired fasting glucose   . Osteopenia of left hip   . Tachycardia, unspecified     Past Surgical History:  Procedure Laterality Date  . COLONOSCOPY     2006, 2018  . DILATION AND CURETTAGE OF UTERUS    . TUBAL LIGATION      Family History  Problem Relation Age of Onset  . Colon cancer Other        uncle and another relative  . Asthma Mother   . Hypertension Mother   . CAD Mother   . Heart failure Mother   . CAD Father   . Hypertension Sister   . CAD Paternal Grandmother   . CAD Paternal Grandfather   . Hypertension Sister   . Arthritis Sister     Social history:  reports that she quit smoking about 48 years ago. She has never used smokeless tobacco. She reports that she drinks about 3.0 oz of alcohol per week. She reports that she does not use drugs.  Medications:  Prior to Admission medications   Medication Sig Start Date End Date Taking? Authorizing Provider  aspirin 81 MG tablet Take 81 mg by mouth every other day.    Yes [provider]  atorvastatin (LIPITOR) 10 MG tablet Take 10 mg by mouth daily.   Yes [provider]  BIOTIN PO Take 1,000 mcg by mouth daily.   Yes [provider]  Cholecalciferol (VITAMIN D-3 PO) Take 2,000 Units by mouth daily.    Yes [provider]  losartan (COZAAR) 50 MG tablet Take 25 mg by mouth daily.   Yes [provider]  Multiple Vitamins-Minerals (PRESERVISION AREDS 2 PO) Take 1 Dose by mouth daily. PRESERVISION AREDS 2   Yes [provider]  nebivolol (BYSTOLIC) 5 MG tablet Take 1 tablet (5 mg total) daily by mouth. Patient taking differently: Take 2.5 mg by mouth daily.  05/26/17  Yes Nahser, Wonda Cheng, MD      Allergies  Allergen Reactions  . Aspirin Other (See Comments)    Unknown reaction per pt  . Sulfa Antibiotics Rash    ROS:  Out of a complete 14 system review of  symptoms, the patient complains only of the following symptoms, and all other reviewed systems are negative.  Cough, snoring Allergies Dizziness  Blood pressure (!) 145/73, pulse 71, height 5\' 4"  (1.626 m), weight 157 lb (71.2 kg).  Physical Exam  General: The patient is alert and cooperative at the time of the examination.  The patient is moderately obese.  Eyes: Pupils are equal, round, and reactive to light. Discs are flat bilaterally.  Neck: The neck is supple, no carotid bruits are noted.  Respiratory: The respiratory examination is clear.  Cardiovascular: The cardiovascular examination reveals a regular rate and rhythm, no obvious murmurs or rubs are noted.  Skin: Extremities are without significant edema.  Neurologic Exam  Mental status: The patient is alert and oriented x 3 at the time of the examination. The patient has apparent normal recent and remote memory, with an apparently normal attention span and concentration ability.  Mini-Mental status examination done today shows a total score 27/30.  Cranial nerves: Facial symmetry is present. There is good sensation of the face to pinprick and soft touch bilaterally. The strength of the facial muscles and the muscles to head turning and shoulder shrug are normal bilaterally. Speech is well enunciated, no aphasia or dysarthria is noted. Extraocular movements are full. Visual fields are full. The tongue is midline, and the patient has symmetric elevation of the soft palate. No obvious hearing deficits are noted.  Motor: The motor testing reveals 5 over 5 strength of all 4 extremities. Good symmetric motor tone is noted throughout.  Sensory: Sensory testing is intact to pinprick, soft touch, vibration sensation, and position sense on all 4 extremities. No evidence of extinction is noted.  Coordination: Cerebellar testing reveals good finger-nose-finger and heel-to-shin bilaterally.  Gait and station: Gait is wide-based,  unsteady.  The patient is not able to perform tandem gait. Romberg is negative, but is unsteady. No drift is seen.  Reflexes: Deep tendon reflexes are symmetric and normal bilaterally, with exception some depression of the ankle jerk reflexes bilaterally. Toes are downgoing bilaterally.   Assessment/Plan:  1.  Gait disturbance, sudden onset  2.  Memory disturbance  3.  Vitamin B12 deficiency  According to the husband the patient had a very sudden rapid change in her ability to ambulate from one day to the next.  Cerebrovascular disease needs to be excluded as an etiology.  The patient has been noted to have a B12 deficiency and she is on supplementation for this.  The husband believes that since onset in November 2018  the gait problem has not been progressive.  The patient will be set up for MRI of the brain.  She will follow-up in about 4 months.  The memory issues will need to be followed over time.  Jill Alexanders MD 10/08/2017 11:51 AM  [facility] [address] [place], [place] [address]  Phone [phone] Fax [phone]

## 2017-10-08 NOTE — Patient Instructions (Signed)
    We will check MRI of the brain. 

## 2017-10-13 ENCOUNTER — Telehealth: Payer: Self-pay | Admitting: Neurology

## 2017-10-13 ENCOUNTER — Ambulatory Visit
Admission: RE | Admit: 2017-10-13 | Discharge: 2017-10-13 | Disposition: A | Discharge status: Home / Self Care | Payer: Medicare Other | Source: Ambulatory Visit | Attending: Neurology | Admitting: Neurology

## 2017-10-13 DIAGNOSIS — R413 Other amnesia: Secondary | ICD-10-CM | POA: Diagnosis not present

## 2017-10-13 DIAGNOSIS — R269 Unspecified abnormalities of gait and mobility: Secondary | ICD-10-CM | POA: Diagnosis not present

## 2017-10-13 NOTE — Telephone Encounter (Signed)
I called the patient.  Her MRI of the brain shows mild to moderate white matter changes, nothing obvious that should be causing severe gait changes.  She does have some enlargement of the ventricular spaces but this correlates with brain atrophy.  There is cortical atrophy that would go along with her memory issues.  The patient herself does not believe she has a memory problem.  She currently is not driving.   MRI brain 10/13/17:  IMPRESSION: This MRI of the brain without contrast shows the following: 1.    T2/FLAIR hyperintense foci in the white matter of both hemispheres consistent with moderate chronic microvascular ischemic change.  None of the foci appears to be acute. 2.    Moderate generalized cortical atrophy that is more pronounced in the mesial temporal lobes in the left parietal lobe.  Additionally, there is corpus callosal atrophy.  Of note, in 2007 brain volume is normal for age on the CT scan.  3.    There are no acute findings.

## 2017-11-13 DIAGNOSIS — H40013 Open angle with borderline findings, low risk, bilateral: Secondary | ICD-10-CM | POA: Diagnosis not present

## 2017-11-19 DIAGNOSIS — R269 Unspecified abnormalities of gait and mobility: Secondary | ICD-10-CM | POA: Diagnosis not present

## 2017-11-19 DIAGNOSIS — E538 Deficiency of other specified B group vitamins: Secondary | ICD-10-CM | POA: Diagnosis not present

## 2017-11-19 DIAGNOSIS — R2689 Other abnormalities of gait and mobility: Secondary | ICD-10-CM | POA: Diagnosis not present

## 2017-11-19 DIAGNOSIS — I10 Essential (primary) hypertension: Secondary | ICD-10-CM | POA: Diagnosis not present

## 2017-11-19 DIAGNOSIS — R413 Other amnesia: Secondary | ICD-10-CM | POA: Diagnosis not present

## 2017-11-19 DIAGNOSIS — M85852 Other specified disorders of bone density and structure, left thigh: Secondary | ICD-10-CM | POA: Diagnosis not present

## 2017-11-19 DIAGNOSIS — J310 Chronic rhinitis: Secondary | ICD-10-CM | POA: Diagnosis not present

## 2017-11-19 DIAGNOSIS — E785 Hyperlipidemia, unspecified: Secondary | ICD-10-CM | POA: Diagnosis not present

## 2017-11-19 DIAGNOSIS — I951 Orthostatic hypotension: Secondary | ICD-10-CM | POA: Diagnosis not present

## 2017-12-15 DIAGNOSIS — H401133 Primary open-angle glaucoma, bilateral, severe stage: Secondary | ICD-10-CM | POA: Diagnosis not present

## 2018-01-14 DIAGNOSIS — H401133 Primary open-angle glaucoma, bilateral, severe stage: Secondary | ICD-10-CM | POA: Diagnosis not present

## 2018-02-19 ENCOUNTER — Encounter: Payer: Self-pay | Admitting: Neurology

## 2018-02-19 ENCOUNTER — Ambulatory Visit (INDEPENDENT_AMBULATORY_CARE_PROVIDER_SITE_OTHER): Payer: Medicare Other | Admitting: Neurology

## 2018-02-19 DIAGNOSIS — R269 Unspecified abnormalities of gait and mobility: Secondary | ICD-10-CM | POA: Diagnosis not present

## 2018-02-19 DIAGNOSIS — R413 Other amnesia: Secondary | ICD-10-CM | POA: Diagnosis not present

## 2018-02-19 HISTORY — DX: Unspecified abnormalities of gait and mobility: R26.9

## 2018-02-19 HISTORY — DX: Other amnesia: R41.3

## 2018-02-19 NOTE — Progress Notes (Signed)
Reason for visit: Memory disturbance, gait disturbance  Susan Hill is an 77 y.o. female  History of present illness:  Susan Hill is a 77 year old right-handed white female with a history of an alteration in her memory and gait that occurred in the fall 2018.  The patient was found to have a low B12 level, she is on oral supplementation currently.  The patient has had ongoing issues with her memory that have not changed much over time.  The patient does not operate a motor vehicle.  She is able to keep up with medications and appointments.  She has wide variations in cognitive functioning levels from one day to the next according to the daughter.  She mainly has short-term memory issues, she has had some change in personality.  The patient has had some delusional thinking as far back as 1 year ago, she is focused on the fact that she believes that her sister is stealing her plates.  The patient has not had any falls, she may use a cane when she is outside of the house.  The patient and her husband do not believe there has been a change in her balance.  Past Medical History:  Diagnosis Date  . Allergic rhinitis due to pollen   . Bone disorder   . Chronic cough   . Chronic rhinitis   . Hyperlipidemia   . Hypertension   . Impaired fasting glucose   . Osteopenia of left hip   . Tachycardia, unspecified     Past Surgical History:  Procedure Laterality Date  . COLONOSCOPY     2006, 2018  . DILATION AND CURETTAGE OF UTERUS    . TUBAL LIGATION      Family History  Problem Relation Age of Onset  . Colon cancer Other        uncle and another relative  . Asthma Mother   . Hypertension Mother   . CAD Mother   . Heart failure Mother   . CAD Father   . Hypertension Sister   . CAD Paternal Grandmother   . CAD Paternal Grandfather   . Hypertension Sister   . Arthritis Sister     Social history:  reports that she quit smoking about 48 years ago. She has never used smokeless  tobacco. She reports that she drinks about 5.0 standard drinks of alcohol per week. She reports that she does not use drugs.    Allergies  Allergen Reactions  . Aspirin Other (See Comments)    Unknown reaction per pt  . Sulfa Antibiotics Rash    Medications:  Prior to Admission medications   Medication Sig Start Date End Date Taking? Authorizing Provider  aspirin 81 MG tablet Take 81 mg by mouth every other day.    Yes [provider]  atorvastatin (LIPITOR) 10 MG tablet Take 10 mg by mouth daily.   Yes [provider]  BIOTIN PO Take 1,000 mcg by mouth daily.   Yes [provider]  Cholecalciferol (VITAMIN D-3 PO) Take 2,000 Units by mouth daily.    Yes [provider]  losartan (COZAAR) 50 MG tablet Take 25 mg by mouth daily.   Yes [provider]  Multiple Vitamins-Minerals (PRESERVISION AREDS 2 PO) Take 1 Dose by mouth daily. PRESERVISION AREDS 2   Yes [provider]  nebivolol (BYSTOLIC) 5 MG tablet Take 1 tablet (5 mg total) daily by mouth. Patient taking differently: Take 2.5 mg by mouth daily.  05/26/17  Yes Nahser, Wonda Cheng, MD    ROS:  Out of a complete 14 system review of symptoms, the patient complains only of the following symptoms, and all other reviewed systems are negative.  Walking difficulty Decreased concentration Memory loss  Blood pressure (!) 154/72, pulse 60, height 5\' 4"  (1.626 m), weight 152 lb (68.9 kg), SpO2 95 %.  Physical Exam  General: The patient is alert and cooperative at the time of the examination.  Skin: No significant peripheral edema is noted.   Neurologic Exam  Mental status: The patient is alert and oriented x 3 at the time of the examination. The Mini-Mental status examination done today shows a total score 27/30.   Cranial nerves: Facial symmetry is present. Speech is normal, no aphasia or dysarthria is noted. Extraocular movements are full. Visual fields are full.  Motor:  The patient has good strength in all 4 extremities.  Sensory examination: Soft touch sensation is symmetric on the face, arms, and legs.  Coordination: The patient has good finger-nose-finger and heel-to-shin bilaterally.  The patient does have some apraxia with use of the extremities.  Gait and station: The patient has a slightly wide-based gait, the patient can walk independently.  Tandem gait is unsteady.  Romberg is negative. No drift is seen.  Reflexes: Deep tendon reflexes are symmetric.   MRI brain 10/13/17:  IMPRESSION: This MRI of the brain without contrast shows the following: 1. T2/FLAIR hyperintense foci in the white matter of both hemispheres consistent with moderate chronic microvascular ischemic change. None of the foci appears to be acute. 2. Moderate generalized cortical atrophy that is more pronounced in the mesial temporal lobes in the left parietal lobe. Additionally, there is corpus callosal atrophy. Of note, in 2007 brain volume is normal for age on the CT scan.  3. There are no acute findings.  * MRI scan images were reviewed online. I agree with the written report.    Assessment/Plan:  1.  Memory disturbance  2.  Gait disturbance  The patient has had a recent MRI of the brain that shows fairly significant cortical atrophy, significantly changed from a CT scan of the brain that was done in 2007.  The patient likely has an Alzheimer's process, the source of the gait disturbance is not clear, and could in part be related to the B12 deficiency.  The patient does not wish to go on medications for memory.  She will be moving in the next 2 to 3 months to [facility].  They will seek a neurologist in that area.  Susan Alexanders MD 02/19/2018 12:35 PM  [facility] [address] [place], [place] [address]  Phone [phone] Fax [phone]

## 2018-02-20 DIAGNOSIS — I10 Essential (primary) hypertension: Secondary | ICD-10-CM | POA: Diagnosis not present

## 2018-02-20 DIAGNOSIS — E785 Hyperlipidemia, unspecified: Secondary | ICD-10-CM | POA: Diagnosis not present

## 2018-02-20 DIAGNOSIS — M85852 Other specified disorders of bone density and structure, left thigh: Secondary | ICD-10-CM | POA: Diagnosis not present

## 2018-02-20 DIAGNOSIS — J310 Chronic rhinitis: Secondary | ICD-10-CM | POA: Diagnosis not present

## 2018-02-20 DIAGNOSIS — R269 Unspecified abnormalities of gait and mobility: Secondary | ICD-10-CM | POA: Diagnosis not present

## 2018-02-20 DIAGNOSIS — E538 Deficiency of other specified B group vitamins: Secondary | ICD-10-CM | POA: Diagnosis not present

## 2018-02-20 DIAGNOSIS — R413 Other amnesia: Secondary | ICD-10-CM | POA: Diagnosis not present

## 2018-02-20 DIAGNOSIS — R2689 Other abnormalities of gait and mobility: Secondary | ICD-10-CM | POA: Diagnosis not present

## 2018-05-01 DIAGNOSIS — Z23 Encounter for immunization: Secondary | ICD-10-CM | POA: Diagnosis not present

## 2018-05-19 ENCOUNTER — Other Ambulatory Visit: Payer: Self-pay | Admitting: Cardiovascular Disease

## 2018-05-27 DIAGNOSIS — E782 Mixed hyperlipidemia: Secondary | ICD-10-CM | POA: Diagnosis not present

## 2018-05-27 DIAGNOSIS — R278 Other lack of coordination: Secondary | ICD-10-CM | POA: Diagnosis not present

## 2018-05-27 DIAGNOSIS — Z1231 Encounter for screening mammogram for malignant neoplasm of breast: Secondary | ICD-10-CM | POA: Diagnosis not present

## 2018-05-27 DIAGNOSIS — I10 Essential (primary) hypertension: Secondary | ICD-10-CM | POA: Diagnosis not present

## 2018-06-11 DIAGNOSIS — E782 Mixed hyperlipidemia: Secondary | ICD-10-CM | POA: Diagnosis not present

## 2018-06-26 DIAGNOSIS — Z1231 Encounter for screening mammogram for malignant neoplasm of breast: Secondary | ICD-10-CM | POA: Diagnosis not present

## 2018-07-10 DIAGNOSIS — E039 Hypothyroidism, unspecified: Secondary | ICD-10-CM | POA: Diagnosis not present

## 2018-07-10 DIAGNOSIS — R739 Hyperglycemia, unspecified: Secondary | ICD-10-CM | POA: Diagnosis not present

## 2018-07-10 DIAGNOSIS — R278 Other lack of coordination: Secondary | ICD-10-CM | POA: Diagnosis not present

## 2018-07-10 DIAGNOSIS — M8580 Other specified disorders of bone density and structure, unspecified site: Secondary | ICD-10-CM | POA: Diagnosis not present

## 2018-07-10 DIAGNOSIS — E782 Mixed hyperlipidemia: Secondary | ICD-10-CM | POA: Diagnosis not present

## 2018-07-10 DIAGNOSIS — I10 Essential (primary) hypertension: Secondary | ICD-10-CM | POA: Diagnosis not present

## 2018-08-05 DIAGNOSIS — E039 Hypothyroidism, unspecified: Secondary | ICD-10-CM | POA: Diagnosis not present

## 2018-08-05 DIAGNOSIS — R739 Hyperglycemia, unspecified: Secondary | ICD-10-CM | POA: Diagnosis not present

## 2018-08-05 DIAGNOSIS — E782 Mixed hyperlipidemia: Secondary | ICD-10-CM | POA: Diagnosis not present

## 2018-08-05 DIAGNOSIS — M8580 Other specified disorders of bone density and structure, unspecified site: Secondary | ICD-10-CM | POA: Diagnosis not present

## 2018-08-06 DIAGNOSIS — M8580 Other specified disorders of bone density and structure, unspecified site: Secondary | ICD-10-CM | POA: Diagnosis not present

## 2018-08-06 DIAGNOSIS — E039 Hypothyroidism, unspecified: Secondary | ICD-10-CM | POA: Diagnosis not present

## 2018-08-06 DIAGNOSIS — B961 Klebsiella pneumoniae [K. pneumoniae] as the cause of diseases classified elsewhere: Secondary | ICD-10-CM | POA: Diagnosis not present

## 2018-08-06 DIAGNOSIS — E782 Mixed hyperlipidemia: Secondary | ICD-10-CM | POA: Diagnosis not present

## 2018-08-06 DIAGNOSIS — N390 Urinary tract infection, site not specified: Secondary | ICD-10-CM | POA: Diagnosis not present

## 2018-08-06 DIAGNOSIS — R739 Hyperglycemia, unspecified: Secondary | ICD-10-CM | POA: Diagnosis not present

## 2018-08-25 DIAGNOSIS — M85852 Other specified disorders of bone density and structure, left thigh: Secondary | ICD-10-CM | POA: Diagnosis not present

## 2018-08-25 DIAGNOSIS — M8588 Other specified disorders of bone density and structure, other site: Secondary | ICD-10-CM | POA: Diagnosis not present

## 2018-10-30 DIAGNOSIS — R3121 Asymptomatic microscopic hematuria: Secondary | ICD-10-CM | POA: Diagnosis not present

## 2018-10-30 DIAGNOSIS — I10 Essential (primary) hypertension: Secondary | ICD-10-CM | POA: Diagnosis not present

## 2018-10-30 DIAGNOSIS — R278 Other lack of coordination: Secondary | ICD-10-CM | POA: Diagnosis not present

## 2018-10-30 DIAGNOSIS — M8580 Other specified disorders of bone density and structure, unspecified site: Secondary | ICD-10-CM | POA: Diagnosis not present

## 2018-10-30 DIAGNOSIS — R739 Hyperglycemia, unspecified: Secondary | ICD-10-CM | POA: Diagnosis not present

## 2018-10-30 DIAGNOSIS — E782 Mixed hyperlipidemia: Secondary | ICD-10-CM | POA: Diagnosis not present

## 2019-02-01 DIAGNOSIS — M8580 Other specified disorders of bone density and structure, unspecified site: Secondary | ICD-10-CM | POA: Diagnosis not present

## 2019-02-01 DIAGNOSIS — R739 Hyperglycemia, unspecified: Secondary | ICD-10-CM | POA: Diagnosis not present

## 2019-02-01 DIAGNOSIS — R278 Other lack of coordination: Secondary | ICD-10-CM | POA: Diagnosis not present

## 2019-02-01 DIAGNOSIS — Z6827 Body mass index (BMI) 27.0-27.9, adult: Secondary | ICD-10-CM | POA: Diagnosis not present

## 2019-02-01 DIAGNOSIS — E782 Mixed hyperlipidemia: Secondary | ICD-10-CM | POA: Diagnosis not present

## 2019-02-01 DIAGNOSIS — R413 Other amnesia: Secondary | ICD-10-CM | POA: Diagnosis not present

## 2019-02-01 DIAGNOSIS — Z713 Dietary counseling and surveillance: Secondary | ICD-10-CM | POA: Diagnosis not present

## 2019-02-01 DIAGNOSIS — Z0001 Encounter for general adult medical examination with abnormal findings: Secondary | ICD-10-CM | POA: Diagnosis not present

## 2019-02-01 DIAGNOSIS — I10 Essential (primary) hypertension: Secondary | ICD-10-CM | POA: Diagnosis not present

## 2019-02-01 DIAGNOSIS — R3121 Asymptomatic microscopic hematuria: Secondary | ICD-10-CM | POA: Diagnosis not present

## 2019-02-01 DIAGNOSIS — Z135 Encounter for screening for eye and ear disorders: Secondary | ICD-10-CM | POA: Diagnosis not present

## 2019-02-02 DIAGNOSIS — R739 Hyperglycemia, unspecified: Secondary | ICD-10-CM | POA: Diagnosis not present

## 2019-02-02 DIAGNOSIS — B9620 Unspecified Escherichia coli [E. coli] as the cause of diseases classified elsewhere: Secondary | ICD-10-CM | POA: Diagnosis not present

## 2019-02-02 DIAGNOSIS — N390 Urinary tract infection, site not specified: Secondary | ICD-10-CM | POA: Diagnosis not present

## 2019-02-02 DIAGNOSIS — R3121 Asymptomatic microscopic hematuria: Secondary | ICD-10-CM | POA: Diagnosis not present

## 2019-02-02 DIAGNOSIS — E782 Mixed hyperlipidemia: Secondary | ICD-10-CM | POA: Diagnosis not present

## 2019-04-08 DIAGNOSIS — Z23 Encounter for immunization: Secondary | ICD-10-CM | POA: Diagnosis not present

## 2019-05-10 DIAGNOSIS — F09 Unspecified mental disorder due to known physiological condition: Secondary | ICD-10-CM | POA: Diagnosis not present

## 2019-05-10 DIAGNOSIS — R2689 Other abnormalities of gait and mobility: Secondary | ICD-10-CM | POA: Diagnosis not present

## 2019-05-18 DIAGNOSIS — R413 Other amnesia: Secondary | ICD-10-CM | POA: Diagnosis not present

## 2019-06-07 DIAGNOSIS — E782 Mixed hyperlipidemia: Secondary | ICD-10-CM | POA: Diagnosis not present

## 2019-06-07 DIAGNOSIS — R3121 Asymptomatic microscopic hematuria: Secondary | ICD-10-CM | POA: Diagnosis not present

## 2019-06-07 DIAGNOSIS — Z713 Dietary counseling and surveillance: Secondary | ICD-10-CM | POA: Diagnosis not present

## 2019-06-07 DIAGNOSIS — Z6826 Body mass index (BMI) 26.0-26.9, adult: Secondary | ICD-10-CM | POA: Diagnosis not present

## 2019-06-07 DIAGNOSIS — R278 Other lack of coordination: Secondary | ICD-10-CM | POA: Diagnosis not present

## 2019-06-07 DIAGNOSIS — R739 Hyperglycemia, unspecified: Secondary | ICD-10-CM | POA: Diagnosis not present

## 2019-06-07 DIAGNOSIS — I10 Essential (primary) hypertension: Secondary | ICD-10-CM | POA: Diagnosis not present

## 2019-06-07 DIAGNOSIS — M8580 Other specified disorders of bone density and structure, unspecified site: Secondary | ICD-10-CM | POA: Diagnosis not present

## 2019-06-21 DIAGNOSIS — R2681 Unsteadiness on feet: Secondary | ICD-10-CM | POA: Diagnosis not present

## 2019-06-21 DIAGNOSIS — F09 Unspecified mental disorder due to known physiological condition: Secondary | ICD-10-CM | POA: Diagnosis not present

## 2024-04-07 DEATH — deceased
